# Patient Record
Sex: Female | Born: 1980 | Race: White | Hispanic: No | Marital: Married | Smoking: Former smoker
Health system: Southern US, Community
[De-identification: ages and names within clinical notes are randomized; demographics above are authoritative.]

## PROBLEM LIST (undated history)

## (undated) DIAGNOSIS — F419 Anxiety disorder, unspecified: Secondary | ICD-10-CM

## (undated) DIAGNOSIS — M1990 Unspecified osteoarthritis, unspecified site: Secondary | ICD-10-CM

## (undated) DIAGNOSIS — F39 Unspecified mood [affective] disorder: Secondary | ICD-10-CM

## (undated) DIAGNOSIS — K219 Gastro-esophageal reflux disease without esophagitis: Secondary | ICD-10-CM

## (undated) DIAGNOSIS — J45909 Unspecified asthma, uncomplicated: Secondary | ICD-10-CM

## (undated) DIAGNOSIS — I10 Essential (primary) hypertension: Secondary | ICD-10-CM

## (undated) DIAGNOSIS — R7309 Other abnormal glucose: Secondary | ICD-10-CM

## (undated) DIAGNOSIS — F319 Bipolar disorder, unspecified: Secondary | ICD-10-CM

## (undated) DIAGNOSIS — F909 Attention-deficit hyperactivity disorder, unspecified type: Secondary | ICD-10-CM

## (undated) DIAGNOSIS — R011 Cardiac murmur, unspecified: Secondary | ICD-10-CM

## (undated) DIAGNOSIS — F4310 Post-traumatic stress disorder, unspecified: Secondary | ICD-10-CM

## (undated) DIAGNOSIS — F32A Depression, unspecified: Secondary | ICD-10-CM

## (undated) DIAGNOSIS — G43909 Migraine, unspecified, not intractable, without status migrainosus: Secondary | ICD-10-CM

## (undated) DIAGNOSIS — L509 Urticaria, unspecified: Secondary | ICD-10-CM

## (undated) DIAGNOSIS — T783XXA Angioneurotic edema, initial encounter: Secondary | ICD-10-CM

## (undated) DIAGNOSIS — F329 Major depressive disorder, single episode, unspecified: Secondary | ICD-10-CM

## (undated) DIAGNOSIS — T7840XA Allergy, unspecified, initial encounter: Secondary | ICD-10-CM

## (undated) DIAGNOSIS — R1310 Dysphagia, unspecified: Secondary | ICD-10-CM

## (undated) DIAGNOSIS — R569 Unspecified convulsions: Secondary | ICD-10-CM

## (undated) DIAGNOSIS — L409 Psoriasis, unspecified: Secondary | ICD-10-CM

## (undated) DIAGNOSIS — J309 Allergic rhinitis, unspecified: Secondary | ICD-10-CM

## (undated) DIAGNOSIS — D649 Anemia, unspecified: Secondary | ICD-10-CM

## (undated) DIAGNOSIS — F1910 Other psychoactive substance abuse, uncomplicated: Secondary | ICD-10-CM

## (undated) HISTORY — DX: Unspecified mood (affective) disorder: F39

## (undated) HISTORY — DX: Psoriasis, unspecified: L40.9

## (undated) HISTORY — PX: TUBAL LIGATION: SHX77

## (undated) HISTORY — DX: Unspecified osteoarthritis, unspecified site: M19.90

## (undated) HISTORY — DX: Bipolar disorder, unspecified: F31.9

## (undated) HISTORY — DX: Attention-deficit hyperactivity disorder, unspecified type: F90.9

## (undated) HISTORY — DX: Unspecified asthma, uncomplicated: J45.909

## (undated) HISTORY — DX: Allergy, unspecified, initial encounter: T78.40XA

## (undated) HISTORY — DX: Anxiety disorder, unspecified: F41.9

## (undated) HISTORY — DX: Migraine, unspecified, not intractable, without status migrainosus: G43.909

## (undated) HISTORY — DX: Post-traumatic stress disorder, unspecified: F43.10

## (undated) HISTORY — DX: Depression, unspecified: F32.A

## (undated) HISTORY — DX: Angioneurotic edema, initial encounter: T78.3XXA

## (undated) HISTORY — DX: Allergic rhinitis, unspecified: J30.9

## (undated) HISTORY — DX: Anemia, unspecified: D64.9

## (undated) HISTORY — DX: Cardiac murmur, unspecified: R01.1

## (undated) HISTORY — DX: Urticaria, unspecified: L50.9

## (undated) HISTORY — DX: Gastro-esophageal reflux disease without esophagitis: K21.9

## (undated) HISTORY — DX: Dysphagia, unspecified: R13.10

## (undated) HISTORY — PX: CERVICAL BIOPSY  W/ LOOP ELECTRODE EXCISION: SUR135

## (undated) HISTORY — DX: Other psychoactive substance abuse, uncomplicated: F19.10

## (undated) HISTORY — DX: Major depressive disorder, single episode, unspecified: F32.9

## (undated) HISTORY — DX: Essential (primary) hypertension: I10

---
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
## 2007-04-15 HISTORY — PX: HEMORROIDECTOMY: SUR656

## 2007-04-15 HISTORY — PX: TUBAL LIGATION: SHX77

## 2008-04-14 HISTORY — PX: INGUINAL HERNIA REPAIR: SHX194

## 2014-04-14 DIAGNOSIS — L409 Psoriasis, unspecified: Secondary | ICD-10-CM

## 2014-04-14 HISTORY — DX: Psoriasis, unspecified: L40.9

## 2015-11-13 DIAGNOSIS — F4310 Post-traumatic stress disorder, unspecified: Secondary | ICD-10-CM

## 2015-11-13 DIAGNOSIS — F419 Anxiety disorder, unspecified: Secondary | ICD-10-CM

## 2015-11-13 HISTORY — DX: Anxiety disorder, unspecified: F41.9

## 2015-11-13 HISTORY — DX: Post-traumatic stress disorder, unspecified: F43.10

## 2016-01-09 ENCOUNTER — Other Ambulatory Visit: Payer: Self-pay

## 2016-01-09 ENCOUNTER — Ambulatory Visit (INDEPENDENT_AMBULATORY_CARE_PROVIDER_SITE_OTHER): Payer: Medicaid Other | Admitting: Allergy

## 2016-01-09 ENCOUNTER — Encounter: Payer: Self-pay | Admitting: Allergy

## 2016-01-09 VITALS — BP 110/70 | HR 100 | Temp 98.3°F | Resp 16 | Ht 62.09 in | Wt 156.4 lb

## 2016-01-09 DIAGNOSIS — L508 Other urticaria: Secondary | ICD-10-CM

## 2016-01-09 DIAGNOSIS — H1010 Acute atopic conjunctivitis, unspecified eye: Secondary | ICD-10-CM | POA: Diagnosis not present

## 2016-01-09 DIAGNOSIS — J309 Allergic rhinitis, unspecified: Secondary | ICD-10-CM | POA: Diagnosis not present

## 2016-01-09 DIAGNOSIS — T783XXA Angioneurotic edema, initial encounter: Secondary | ICD-10-CM | POA: Insufficient documentation

## 2016-01-09 DIAGNOSIS — J4540 Moderate persistent asthma, uncomplicated: Secondary | ICD-10-CM | POA: Diagnosis not present

## 2016-01-09 MED ORDER — FLUTICASONE PROPIONATE 50 MCG/ACT NA SUSP: NASAL | 5 refills | Status: DC

## 2016-01-09 MED ORDER — IPRATROPIUM-ALBUTEROL 0.5-2.5 (3) MG/3ML IN SOLN: 3.0000 mL | Freq: Four times a day (QID) | RESPIRATORY_TRACT | Status: AC

## 2016-01-09 MED ORDER — FEXOFENADINE HCL 180 MG PO TABS: 180.0000 mg | ORAL_TABLET | Freq: Every day | ORAL | 5 refills | Status: DC

## 2016-01-09 MED ORDER — RANITIDINE HCL 150 MG PO TABS: 150.0000 mg | ORAL_TABLET | Freq: Two times a day (BID) | ORAL | 5 refills | Status: DC

## 2016-01-09 MED ORDER — BECLOMETHASONE DIPROPIONATE 80 MCG/ACT IN AERS: 2.0000 | INHALATION_SPRAY | Freq: Two times a day (BID) | RESPIRATORY_TRACT | 5 refills | Status: DC

## 2016-01-09 NOTE — Patient Instructions (Addendum)
Hives and swelling, chronic     - will obtain following labs: CBC w diff, CMP, ESR, hive panel, tryptase     - start Allegra 136m and Zantac 1547mtwice a day     - may use benadryl 25-5075ms needed for breakthrough symptoms     - let us [place] if you have any of these symptoms with your hives and swelling: fevers, joint pains, marks or bruising (without scratching)  Asthma    - currently not well controlled with as needed albuterol     - start Qvar 80 2 puffs twice a day    - use albuterol as needed   Asthma control goals:   Full participation in all desired activities (may need albuterol before activity)  Albuterol use two time or less a week on average (not counting use with activity)  Cough interfering with sleep two time or less a month  Oral steroids no more than once a year  No hospitalizations  Seasonal Allergies    - will obtain environmental allergen panel via blood work    - take [employer] above    - if still having nasal congestion or drainage recommend using Flonase 1-2 spray each nostril as needed  Follow-up 1-2 months

## 2016-01-09 NOTE — Progress Notes (Signed)
New Patient Note  RE: Sarah Moreno MRN: [id number] DOB: 1980/08/18 Date of Office Visit: 01/09/2016  Referring provider: Enid Skeens., MD Primary care provider: Enid Skeens., MD  Chief Complaint: Hives, asthma  History of present illness: Sarah Moreno is a 35 y.o. female presenting today for consultation for hives and asthma. She presents today with her boyfriend.  She has been having hives now for the past 6-7 months.  Initially she would have hives 1-2 times a week which has progressed to daily hives.  She has to take benadryl at night to be able to sleep. Hives resolve in 1-2 days and can occur all over her body. Hives are red and raised and itchy.  They do not leave any marks or bruising.  She does scratch and break the skin because of the itch and thus will have scabbing and bruising related to that.  She does endorse swelling of her neck with the hives. She reports that hot showers make the hives worse otherwise she has not noted any other things that make the hives worse like pressure or exercise.  She has taken ibuprofen on occasion and has not noted any worsening of her hives. She denies any preceding illnesses, no new foods or medications, no stings, no change in soaps or lotions or detergents.  She has not tried to take anything else besides Benadryl. She was prescribed EpiPen by her PCP.  She does have asthma and was diagnosed during her first pregnancy in 2006.  She uses proair 3-4 times a day 2 puffs for wheezing and coughing over past week due to PNA.  She was diagnosed with PNA on exam last week and is on levaquin and steroids; she has 2 more days.  Subsequently she has not noted any improvement in her hives being on steroids currently. Prior to her illness she reports using Pro Air at least once a week.   She does endorse nighttime awakenings 2 nights/week.  She has never been on ICS or other controller medications. She feels that her asthma symptoms are not well-controlled.  She has identified triggers that worsen her asthma symptoms include being around cut grass, pollens, cats, after rains, cold weather and illnesses.    She does have itchy water eyes, nasal congestion and drainage as well as sneezing worse during spring, summer and fall. She reports the winter usually good from an allergy standpoint.  Usually will take benadryl.    She does have a history of smoking for 4 years and stopped in 2000.      Review of systems: Review of Systems  Constitutional: Negative for fever.  HENT: Positive for congestion. Negative for sore throat.   Eyes: Negative for redness.  Respiratory: Positive for cough, shortness of breath and wheezing.   Cardiovascular: Negative for chest pain.  Gastrointestinal: Negative for nausea and vomiting.  Skin: Positive for itching and rash.  Neurological: Negative for headaches.  Endo/Heme/Allergies: Positive for environmental allergies.  Psychiatric/Behavioral: Positive for depression. The patient is nervous/anxious.     All other systems negative unless noted above in HPI  Past medical history: Past Medical History:  Diagnosis Date  . Angio-edema   . Anxiety 11/2015  . Asthma   . Bipolar 1 disorder ([place]) 2000  . Psoriasis 2016  . PTSD (post-traumatic stress disorder) 11/2015  . Urticaria     Past surgical history: Past Surgical History:  Procedure Laterality Date  . INGUINAL HERNIA REPAIR Left 2010  . TUBAL LIGATION  2009  Family history:  Family History  Problem Relation Age of Onset  . Depression Mother   . Depression Father   . COPD Father   . Depression Sister   . Breast cancer Maternal Aunt   . Schizophrenia Paternal Aunt   . Depression Paternal Aunt   . Suicidality Paternal Uncle   . Depression Paternal Uncle   . Depression Sister   . Epilepsy Sister   . Breast cancer Maternal Aunt   . Breast cancer Maternal Aunt   . Depression Paternal Aunt   . Depression Paternal Aunt   . Depression Paternal  Aunt   . Depression Paternal Aunt   . Depression Paternal Aunt   . Depression Paternal Uncle   . Depression Paternal Uncle     Social history: She lives in a home with carpeting with electric heating and central cooling. There is a dog in the bearded dragon in the home. She is unemployed.   Medication List:   Medication List       Accurate as of 01/09/16  3:56 PM. Always use your most recent med list.          beclomethasone 80 MCG/ACT inhaler Commonly known as:  QVAR Inhale 2 puffs into the lungs 2 (two) times daily.   fexofenadine 180 MG tablet Commonly known as:  ALLEGRA Take 1 tablet (180 mg total) by mouth daily.   fluticasone 50 MCG/ACT nasal spray Commonly known as:  FLONASE Use 1-2 sprays in each nostril once daily as needed   levofloxacin 500 MG tablet Commonly known as:  LEVAQUIN Take 1 tablet by mouth daily. 10 day rx   Oxcarbazepine 300 MG tablet Commonly known as:  TRILEPTAL Take 1 tablet by mouth 2 (two) times daily.   predniSONE 10 MG tablet Commonly known as:  DELTASONE Taper dose x 10 days   PROAIR HFA 108 (90 Base) MCG/ACT inhaler Generic drug:  albuterol Inhale 2 puffs into the lungs every 4 (four) hours as needed for wheezing or shortness of breath.   ranitidine 150 MG tablet Commonly known as:  ZANTAC Take 1 tablet (150 mg total) by mouth 2 (two) times daily.       Known medication allergies: Allergies  Allergen Reactions  . Nsaids Other (See Comments)    Unknown reaction  . Other Hives    Mouth numbing agents  . Penicillin G Hives    Unknown reaction     Physical examination: Blood pressure 110/70, pulse 100, temperature 98.3 F (36.8 C), temperature source Oral, resp. rate 16, height 5' 2.09" (1.577 m), weight 156 lb 6.4 oz (70.9 kg).  General: Alert, interactive, in no acute distress. HEENT: TMs pearly gray, turbinates moderately edematous with clear discharge, post-pharynx non erythematous. Neck: Supple without  lymphadenopathy. Lungs: Decreased breath sounds bilaterally without wheezing, rhonchi or rales. {no increased work of breathing. Coughing throughout encounter CV: Normal S1, S2 without murmurs. Abdomen: Nondistended, nontender. Skin: Scattered erythematous urticarial type lesions primarily located Arms, back , nonvesicular. Extremities:  No clubbing, cyanosis or edema. Neuro:   Grossly intact.  Diagnositics/Labs:  Spirometry: FEV1: 2.46L  84%, FVC: 3.17L  91%, ratio consistent with Nonobstructive pattern  Allergy testing: Deferred as patient is dermatographic Allergy testing results were read and interpreted by provider, documented by clinical staff.   Assessment and plan:   Urticaria with angioedema, chronic     - will obtain following labs: CBC w diff, CMP, ESR, hive panel, tryptase     - start Allegra 14m and Zantac  161m twice a day     - may use benadryl 25-514mas needed for breakthrough symptoms     - let us[place]ow if you have any of these symptoms with your urticaria and angioedema: fevers, joint pains, marks or bruising (without scratching)     - If she continues to have urticaria and angioedema despite high-dose antihistamine therapy will proceed with Xolair     - Discussed the option of Singulair with  patient today and given her history of bipolar depression and PTSD decided to not start on this medication     - She already has an EpiPen prescribed by her PCP  Asthma, moderate persistent    - currently not well controlled with as needed albuterol     - start Qvar 80 2 puffs twice a day    - use albuterol as needed   Asthma control goals:   Full participation in all desired activities (may need albuterol before activity)  Albuterol use two time or less a week on average (not counting use with activity)  Cough interfering with sleep two time or less a month  Oral steroids no more than once a year  No hospitalizations  Allergic rhinoconjunctivitis    - will  obtain environmental allergen panel via blood work    - take [employer] above    - if still having nasal congestion or drainage recommend using Flonase 1-2 spray each nostril as needed  Follow-up 1-2 months   I appreciate the opportunity to take part in [place]. Please do not hesitate to contact me with questions.  Sincerely,   ShPrudy FeelerMD Allergy/[facility]ology [facility]

## 2016-01-10 ENCOUNTER — Other Ambulatory Visit: Payer: Self-pay | Admitting: *Deleted

## 2016-01-10 DIAGNOSIS — J309 Allergic rhinitis, unspecified: Principal | ICD-10-CM

## 2016-01-10 DIAGNOSIS — H1010 Acute atopic conjunctivitis, unspecified eye: Secondary | ICD-10-CM

## 2016-01-10 MED ORDER — FLUTICASONE PROPIONATE 50 MCG/ACT NA SUSP: NASAL | 5 refills | Status: DC

## 2016-01-13 LAB — CBC WITH DIFFERENTIAL/PLATELET
BASOS: 0 %
Basophils Absolute: 0 10*3/uL (ref 0.0–0.2)
EOS (ABSOLUTE): 0.2 10*3/uL (ref 0.0–0.4)
EOS: 2 %
Hematocrit: 42.6 % (ref 34.0–46.6)
Hemoglobin: 14.5 g/dL (ref 11.1–15.9)
IMMATURE GRANULOCYTES: 0 %
Immature Grans (Abs): 0 10*3/uL (ref 0.0–0.1)
LYMPHS: 33 %
Lymphocytes Absolute: 4.6 10*3/uL — ABNORMAL HIGH (ref 0.7–3.1)
MCH: 29.4 pg (ref 26.6–33.0)
MCHC: 34 g/dL (ref 31.5–35.7)
MCV: 86 fL (ref 79–97)
MONOCYTES: 6 %
MONOS ABS: 0.8 10*3/uL (ref 0.1–0.9)
NEUTROS PCT: 59 %
Neutrophils Absolute: 8.4 10*3/uL — ABNORMAL HIGH (ref 1.4–7.0)
PLATELETS: 323 10*3/uL (ref 150–379)
RBC: 4.94 x10E6/uL (ref 3.77–5.28)
RDW: 14.5 % (ref 12.3–15.4)
WBC: 14.1 10*3/uL — AB (ref 3.4–10.8)

## 2016-01-13 LAB — ALLERGENS W/TOTAL IGE AREA 2
Bermuda Grass IgE: <0.1 kU/L
Cat Dander IgE: <0.1 kU/L
Cladosporium Herbarum IgE: <0.1 kU/L
Dog Dander IgE: <0.1 kU/L
Elm, American IgE: <0.1 kU/L
IgE (Immunoglobulin E), Serum: 43 IU/mL (ref 0–100)
Johnson Grass IgE: <0.1 kU/L
Maple/Box Elder IgE: <0.1 kU/L
Pecan, Hickory IgE: <0.1 kU/L
Penicillium Chrysogen IgE: <0.1 kU/L
Sheep Sorrel IgE Qn: <0.1 kU/L
Timothy Grass IgE: <0.1 kU/L
White Mulberry IgE: <0.1 kU/L

## 2016-01-13 LAB — COMPREHENSIVE METABOLIC PANEL
A/G RATIO: 1.6 (ref 1.2–2.2)
ALT: 9 IU/L (ref 0–32)
AST: 11 IU/L (ref 0–40)
Albumin: 4.7 g/dL (ref 3.5–5.5)
Alkaline Phosphatase: 95 IU/L (ref 39–117)
BUN/Creatinine Ratio: 9 (ref 9–23)
BUN: 9 mg/dL (ref 6–20)
Bilirubin Total: <0.2 mg/dL (ref 0.0–1.2)
CALCIUM: 9.5 mg/dL (ref 8.7–10.2)
CHLORIDE: 100 mmol/L (ref 96–106)
CO2: 26 mmol/L (ref 18–29)
Creatinine, Ser: 1.03 mg/dL — ABNORMAL HIGH (ref 0.57–1.00)
GFR calc Af Amer: 83 mL/min/{1.73_m2} (ref >59)
GFR, EST NON AFRICAN AMERICAN: 72 mL/min/{1.73_m2} (ref >59)
Globulin, Total: 2.9 g/dL (ref 1.5–4.5)
Glucose: 104 mg/dL — ABNORMAL HIGH (ref 65–99)
POTASSIUM: 4 mmol/L (ref 3.5–5.2)
Sodium: 144 mmol/L (ref 134–144)
Total Protein: 7.6 g/dL (ref 6.0–8.5)

## 2016-01-13 LAB — TRYPTASE: Tryptase: 4.2 ug/L (ref 2.2–13.2)

## 2016-02-04 ENCOUNTER — Telehealth: Payer: Self-pay | Admitting: *Deleted

## 2016-02-04 NOTE — Telephone Encounter (Signed)
Pt called to check on lab results from 01-09-16. For an unknown reason, results were never sent to Dr. Delorse LekPadgett to be reviewed. Per Dr. Lucie LeatherKozlow - All testing was normal, patient informed

## 2016-02-14 ENCOUNTER — Other Ambulatory Visit: Payer: Self-pay | Admitting: Obstetrics & Gynecology

## 2016-02-14 DIAGNOSIS — N6452 Nipple discharge: Secondary | ICD-10-CM

## 2016-02-21 ENCOUNTER — Other Ambulatory Visit: Payer: Self-pay

## 2016-05-02 ENCOUNTER — Other Ambulatory Visit: Payer: Self-pay

## 2016-05-05 ENCOUNTER — Other Ambulatory Visit: Payer: Self-pay

## 2016-07-15 ENCOUNTER — Other Ambulatory Visit: Payer: Self-pay | Admitting: Allergy and Immunology

## 2016-07-15 DIAGNOSIS — J4540 Moderate persistent asthma, uncomplicated: Secondary | ICD-10-CM

## 2016-07-28 ENCOUNTER — Other Ambulatory Visit: Payer: Self-pay | Admitting: Allergy & Immunology

## 2016-07-28 DIAGNOSIS — T783XXA Angioneurotic edema, initial encounter: Secondary | ICD-10-CM

## 2016-07-28 DIAGNOSIS — L508 Other urticaria: Secondary | ICD-10-CM

## 2016-09-03 ENCOUNTER — Other Ambulatory Visit: Payer: Self-pay | Admitting: Allergy and Immunology

## 2016-09-03 DIAGNOSIS — J4540 Moderate persistent asthma, uncomplicated: Secondary | ICD-10-CM

## 2017-02-17 ENCOUNTER — Other Ambulatory Visit: Payer: Self-pay | Admitting: Allergy and Immunology

## 2017-02-17 DIAGNOSIS — H1010 Acute atopic conjunctivitis, unspecified eye: Secondary | ICD-10-CM

## 2017-02-17 DIAGNOSIS — J4540 Moderate persistent asthma, uncomplicated: Secondary | ICD-10-CM

## 2017-02-17 DIAGNOSIS — J309 Allergic rhinitis, unspecified: Principal | ICD-10-CM

## 2018-01-27 ENCOUNTER — Encounter: Payer: Self-pay | Admitting: Gastroenterology

## 2018-03-01 ENCOUNTER — Encounter: Payer: Self-pay | Admitting: Emergency Medicine
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
## 2018-03-02 ENCOUNTER — Encounter: Payer: Self-pay | Admitting: Gastroenterology

## 2018-03-02 ENCOUNTER — Ambulatory Visit: Payer: Medicaid Other | Admitting: Gastroenterology

## 2018-03-02 ENCOUNTER — Other Ambulatory Visit (HOSPITAL_COMMUNITY): Payer: Self-pay | Admitting: *Deleted

## 2018-03-02 VITALS — BP 106/78 | HR 70 | Ht 63.0 in | Wt 180.4 lb

## 2018-03-02 DIAGNOSIS — R1310 Dysphagia, unspecified: Secondary | ICD-10-CM

## 2018-03-02 DIAGNOSIS — K625 Hemorrhage of anus and rectum: Secondary | ICD-10-CM

## 2018-03-02 MED ORDER — OMEPRAZOLE 40 MG PO CPDR: 40.0000 mg | DELAYED_RELEASE_CAPSULE | Freq: Two times a day (BID) | ORAL | 3 refills | Status: DC

## 2018-03-02 NOTE — Patient Instructions (Signed)
Use Omeprazole 40 mg twice a day for 2 months.   Add a daily fiber powder such as Benefiber or Citrucel.     You have been scheduled for a modified barium swallow on 03/09/18 at 1:00 pm. Please arrive 15 minutes prior to your test for registration. [facility] for your appointment. Should you need to cancel or reschedule your appointment, please contact [phone] Patrcia Dolly([place]) or [phone] [facility]([place]). _____________________________________________________________________ A Modified Barium Swallow Study, or MBS, is a special x-ray that is taken to check swallowing skills. It is carried out by a Marine scientistadiologist and a Warehouse managerpeech Language Pathologist (SLP). During this test, your mouth, throat, and esophagus, a muscular tube which connects your mouth to your stomach, is checked. The test will help you, your doctor, and the SLP plan what types of foods and liquids are easier for you to swallow. The SLP will also identify positions and ways to help you swallow more easily and safely. What will happen during an MBS? You will be taken to an x-ray room and seated comfortably. You will be asked to swallow small amounts of food and liquid mixed with barium. Barium is a liquid or paste that allows images of your mouth, throat and esophagus to be seen on x-ray. The x-ray captures moving images of the food you are swallowing as it travels from your mouth through your throat and into your esophagus. This test helps identify whether food or liquid is entering your lungs (aspiration). The test also shows which part of your mouth or throat lacks strength or coordination to move the food or liquid in the right direction. This test typically takes 30 minutes to 1 hour to complete. _______________________________________________________________________

## 2018-03-09 ENCOUNTER — Ambulatory Visit (HOSPITAL_COMMUNITY)
Admission: RE | Admit: 2018-03-09 | Discharge: 2018-03-09 | Disposition: A | Discharge status: Home / Self Care | Payer: Medicaid Other | Source: Ambulatory Visit | Attending: Gastroenterology | Admitting: Gastroenterology

## 2018-03-09 ENCOUNTER — Encounter (HOSPITAL_COMMUNITY): Payer: Self-pay

## 2018-03-09 DIAGNOSIS — R1310 Dysphagia, unspecified: Secondary | ICD-10-CM | POA: Insufficient documentation

## 2018-03-29 ENCOUNTER — Ambulatory Visit (AMBULATORY_SURGERY_CENTER): Payer: Self-pay

## 2018-03-29 VITALS — Ht 62.0 in | Wt 184.0 lb

## 2018-03-29 DIAGNOSIS — K625 Hemorrhage of anus and rectum: Secondary | ICD-10-CM

## 2018-03-29 MED ORDER — NA SULFATE-K SULFATE-MG SULF 17.5-3.13-1.6 GM/177ML PO SOLN: 1.0000 | Freq: Once | ORAL | 0 refills | Status: AC

## 2018-03-29 NOTE — Progress Notes (Signed)
Denies allergies to eggs or soy products. Denies complication of anesthesia or sedation. Denies use of weight loss medication. Denies use of O2.   Emmi instructions declined.  

## 2018-04-20 ENCOUNTER — Encounter: Payer: Medicaid Other | Admitting: Gastroenterology

## 2018-04-26 ENCOUNTER — Encounter: Payer: Self-pay | Admitting: Gastroenterology

## 2018-04-26 ENCOUNTER — Ambulatory Visit (AMBULATORY_SURGERY_CENTER): Payer: Medicaid Other | Admitting: Gastroenterology

## 2018-04-26 VITALS — BP 149/76 | HR 78 | Temp 98.6°F | Resp 17 | Ht 62.0 in | Wt 184.0 lb

## 2018-04-26 DIAGNOSIS — K625 Hemorrhage of anus and rectum: Secondary | ICD-10-CM

## 2018-04-26 DIAGNOSIS — R1310 Dysphagia, unspecified: Secondary | ICD-10-CM

## 2018-04-26 DIAGNOSIS — R1319 Other dysphagia: Secondary | ICD-10-CM

## 2018-04-26 DIAGNOSIS — K2270 Barrett's esophagus without dysplasia: Secondary | ICD-10-CM | POA: Diagnosis not present

## 2018-04-26 DIAGNOSIS — K210 Gastro-esophageal reflux disease with esophagitis: Secondary | ICD-10-CM | POA: Diagnosis not present

## 2018-04-26 HISTORY — PX: UPPER GASTROINTESTINAL ENDOSCOPY: SHX188

## 2018-04-26 MED ORDER — SODIUM CHLORIDE 0.9 % IV SOLN: 500.0000 mL | Freq: Once | INTRAVENOUS | Status: DC

## 2018-04-26 NOTE — Op Note (Signed)
[facility] Patient Name: Sarah RoeLois Yi Procedure Date: 04/26/2018 10:19 AM MRN: [id number] Endoscopist: Tressia DanasKimberly Janila Arrazola MD, MD Age: 38 Referring MD:  Date of Birth: Jan 25, 1981 Gender: Female Account #: [id number] Procedure:                Upper GI endoscopy Indications:              Suspected gastro-esophageal reflux disease Medicines:                See the Anesthesia note for documentation of the                            administered medications Procedure:                Pre-Anesthesia Assessment:                           - Prior to the procedure, a History and Physical                            was performed, and patient medications and                            allergies were reviewed. The patient's tolerance of                            previous anesthesia was also reviewed. The risks                            and benefits of the procedure and the sedation                            options and risks were discussed with the patient.                            All questions were answered, and informed consent                            was obtained. Prior Anticoagulants: The patient has                            taken no previous anticoagulant or antiplatelet                            agents. ASA Grade Assessment: II - A patient with                            mild systemic disease. After reviewing the risks                            and benefits, the patient was deemed in                            satisfactory condition to undergo the procedure.  After obtaining informed consent, the endoscope was                            passed under direct vision. Throughout the                            procedure, the patient's blood pressure, pulse, and                            oxygen saturations were monitored continuously. The                            Endoscope was introduced through the mouth, and                            advanced to  the second part of duodenum. The upper                            GI endoscopy was accomplished without difficulty.                            The patient tolerated the procedure well. Scope In: Scope Out: Findings:                 LA Grade B (one or more mucosal breaks greater than                            5 mm, not extending between the tops of two mucosal                            folds) esophagitis with no bleeding was found.                            Biopsies were taken with a cold forceps for                            histology.                           Diffuse nodular mucosa was found in the gastric                            body. Biopsies were taken with a cold forceps for                            histology. Estimated blood loss was minimal.                           The examined duodenum was normal. Complications:            No immediate complications. Estimated blood loss:                            Minimal. Estimated Blood Loss:     Estimated blood loss was  minimal. Impression:               - LA Grade B reflux esophagitis. Rule out Barrett's                            esophagus. Biopsied.                           - Nodular mucosa in the gastric body. Biopsied.                           - Normal examined duodenum. Recommendation:           - Discharge patient to home.                           - Resume regular diet.                           - Continue present medications including omeprazole.                           - Proceed with colonoscopy today as previously                            planned.                           - Follow-up in the office - next available - to                            review these results. Tressia Danas MD, MD 04/26/2018 11:06:09 AM This report has been signed electronically.

## 2018-04-26 NOTE — Progress Notes (Signed)
Pt's states no medical or surgical changes since previsit or office visit. 

## 2018-04-26 NOTE — Progress Notes (Signed)
Called to room to assist during endoscopic procedure.  Patient ID and intended procedure confirmed with present staff. Received instructions for my participation in the procedure from the performing physician.  

## 2018-04-26 NOTE — Op Note (Signed)
[facility] Patient Name: Sarah Moreno Procedure Date: 04/26/2018 10:19 AM MRN: [id number] Endoscopist: Tressia DanasKimberly Jaylynn Mcaleer MD, MD Age: 38 Referring MD:  Date of Birth: 11-Mar-1981 Gender: Female Account #: [id number] Procedure:                Colonoscopy Indications:              Rectal bleeding Medicines:                See the Anesthesia note for documentation of the                            administered medications Procedure:                Pre-Anesthesia Assessment:                           - Prior to the procedure, a History and Physical                            was performed, and patient medications and                            allergies were reviewed. The patient's tolerance of                            previous anesthesia was also reviewed. The risks                            and benefits of the procedure and the sedation                            options and risks were discussed with the patient.                            All questions were answered, and informed consent                            was obtained. Prior Anticoagulants: The patient has                            taken no previous anticoagulant or antiplatelet                            agents. ASA Grade Assessment: I - A normal, healthy                            patient. After reviewing the risks and benefits,                            the patient was deemed in satisfactory condition to                            undergo the procedure.  After obtaining informed consent, the colonoscope                            was passed under direct vision. Throughout the                            procedure, the patient's blood pressure, pulse, and                            oxygen saturations were monitored continuously. The                            Colonoscope was introduced through the anus and                            advanced to the the terminal ileum, with              identification of the appendiceal orifice and IC                            valve. The colonoscopy was performed without                            difficulty. The patient tolerated the procedure                            well. The quality of the bowel preparation was good. Scope In: 10:41:23 AM Scope Out: 10:54:09 AM Scope Withdrawal Time: 0 hours 10 minutes 37 seconds  Total Procedure Duration: 0 hours 12 minutes 46 seconds  Findings:                 Hemorrhoids were found on perianal exam.                           Non-bleeding external and internal hemorrhoids were                            found. The hemorrhoids were small.                           The exam was otherwise without abnormality on                            direct and retroflexion views. Complications:            No immediate complications. Estimated Blood Loss:     Estimated blood loss: none. Impression:               - Hemorrhoids found on perianal exam.                           - Non-bleeding external and internal hemorrhoids.                           - The examination was otherwise normal on direct  and retroflexion views.                           - No specimens collected. Recommendation:           - Discharge patient to home.                           - Resume previous diet.                           - Daily psyllium (Metamucil, Citrucel, etc)                            recommended to keep the stool soft.                           - Continue present medications.                           - Continue with routine colon cancer screening.                            Next colonoscopy in 10 years at age 11, or earlier                            with any new symptoms. Tressia Danas MD, MD 04/26/2018 11:09:24 AM This report has been signed electronically.

## 2018-04-26 NOTE — Patient Instructions (Signed)
Use psyllium (Metamucil, Citrucel, etc) daily to keep stool soft.   YOU HAD AN ENDOSCOPIC PROCEDURE TODAY AT THE [facility]:   Refer to the procedure report that was given to you for any specific questions about what was found during the examination.  If the procedure report does not answer your questions, please call your gastroenterologist to clarify.  If you requested that your care partner not be given the details of your procedure findings, then the procedure report has been included in a sealed envelope for you to review at your convenience later.  YOU SHOULD EXPECT: Some feelings of bloating in the abdomen. Passage of more gas than usual.  Walking can help get rid of the air that was put into your GI tract during the procedure and reduce the bloating. If you had a lower endoscopy (such as a colonoscopy or flexible sigmoidoscopy) you may notice spotting of blood in your stool or on the toilet paper. If you underwent a bowel prep for your procedure, you may not have a normal bowel movement for a few days.  Please Note:  You might notice some irritation and congestion in your nose or some drainage.  This is from the oxygen used during your procedure.  There is no need for concern and it should clear up in a day or so.  SYMPTOMS TO REPORT IMMEDIATELY:   Following lower endoscopy (colonoscopy or flexible sigmoidoscopy):  Excessive amounts of blood in the stool  Significant tenderness or worsening of abdominal pains  Swelling of the abdomen that is new, acute  Fever of 100F or higher   Following upper endoscopy (EGD)  Vomiting of blood or coffee ground material  New chest pain or pain under the shoulder blades  Painful or persistently difficult swallowing  New shortness of breath  Fever of 100F or higher  Black, tarry-looking stools  For urgent or emergent issues, a gastroenterologist can be reached at any hour by calling [phone].   DIET:  We do recommend a small  meal at first, but then you may proceed to your regular diet.  Drink plenty of fluids but you should avoid alcoholic beverages for 24 hours.  ACTIVITY:  You should plan to take it easy for the rest of today and you should NOT DRIVE or use heavy machinery until tomorrow (because of the sedation medicines used during the test).    FOLLOW UP: Our staff will call the number listed on your records the next business day following your procedure to check on you and address any questions or concerns that you may have regarding the information given to you following your procedure. If we do not reach you, we will leave a message.  However, if you are feeling well and you are not experiencing any problems, there is no need to return our call.  We will assume that you have returned to your regular daily activities without incident.  If any biopsies were taken you will be contacted by phone or by letter within the next 1-3 weeks.  Please call us at [phone] if you have not heard about the biopsies in 3 weeks.    SIGNATURES/CONFIDENTIALITY: You and/or your care partner have signed paperwork which will be entered into your electronic medical record.  These signatures attest to the fact that that the information above on your After Visit Summary has been reviewed and is understood.  Full responsibility of the confidentiality of this discharge information lies with you and/or your care-partner.

## 2018-04-26 NOTE — Progress Notes (Signed)
Report given to PACU, vss 

## 2018-04-27 ENCOUNTER — Telehealth: Payer: Self-pay | Admitting: *Deleted

## 2018-04-27 NOTE — Telephone Encounter (Signed)
  Follow up Call-  Call back number 04/26/2018  Post procedure Call Back phone  # [phone]  Permission to leave phone message Yes     Patient questions:  Do you have a fever, pain , or abdominal swelling? Yes.   Pain Score  4 *  Have you tolerated food without any problems? Yes.    Have you been able to return to your normal activities? Yes.    Do you have any questions about your discharge instructions: Diet   No. Medications  No. Follow up visit  No.  Do you have questions or concerns about your Care? Yes.  Patient reports that she is having lower left and middle abdominal pain that is a 4/10 since her procedure. No fever or any other symptoms. Will notify Dr. Orvan Falconer.  Actions: * If pain score is 4 or above: Physician/ provider Notified : Cala Bradford L. Orvan Falconer, MD.

## 2018-04-27 NOTE — Telephone Encounter (Signed)
I am sorry to hear that she is having trouble. Her exam was performed without immediate complication or difficulty at the time. I am thankful that she has tolerated food. If the pain persists or worsens, I would like her to have an office visit to be evaluated. Please schedule with an APP if available. Thanks.

## 2018-04-27 NOTE — Telephone Encounter (Signed)
Called the patient back to discuss Dr. Daleen Bo recommendation regarding her abdominal pain. She reports that it has eased off since talking with her this am and if she isnt moving she has no pain, pain 3/10 when ambulating. She verbalized understanding and will call us and make an appointment if her pain persists or worsens Sm

## 2018-05-03 ENCOUNTER — Encounter: Payer: Self-pay | Admitting: Gastroenterology

## 2018-05-18 ENCOUNTER — Ambulatory Visit: Payer: Medicaid Other | Admitting: Gastroenterology

## 2018-05-18 ENCOUNTER — Encounter: Payer: Self-pay | Admitting: Gastroenterology

## 2018-05-18 VITALS — BP 124/74 | HR 84 | Ht 62.0 in | Wt 188.0 lb

## 2018-05-18 DIAGNOSIS — K22719 Barrett's esophagus with dysplasia, unspecified: Secondary | ICD-10-CM

## 2018-05-18 DIAGNOSIS — R933 Abnormal findings on diagnostic imaging of other parts of digestive tract: Secondary | ICD-10-CM

## 2018-05-18 DIAGNOSIS — R1310 Dysphagia, unspecified: Secondary | ICD-10-CM | POA: Diagnosis not present

## 2018-05-18 MED ORDER — FAMOTIDINE 20 MG PO TABS: 20.0000 mg | ORAL_TABLET | Freq: Two times a day (BID) | ORAL | 3 refills | Status: DC
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
## 2018-05-18 NOTE — Patient Instructions (Addendum)
* Continue Omeprazole 40 mg twice daily. * Add Pepcid 20 mg twice daily. * Barrett's brochure provided * I am recommending high resolution esophageal manometry and pH probe on medications to further evaluate your heartburn and swallowing problems. * Repeat EGD in 6 months for Barrett's biopsies  You have been scheduled for an esophageal manometry at [facility] on 05-19-18 at 8:30 am. Please arrive 30 minutes prior to your procedure for registration. You will need to go to outpatient registration (1st floor of the hospital) first. Make certain to bring your insurance cards as well as a complete list of medications.  Please remember the following:  1) Do not take any muscle relaxants, xanax (alprazolam) or ativan for 1 day prior to your test as well as the day of the test.  2) Nothing to eat or drink for 4 hours before your test.  3) Hold all diabetic medications/insulin the morning of the test. You may eat and take your medications after the test.  It will take at least 2 weeks to receive the results of this test from your physician. ------------------------------------------ ABOUT ESOPHAGEAL MANOMETRY Esophageal manometry (muh-NOM-uh-tree) is a test that gauges how well your esophagus works. Your esophagus is the long, muscular tube that connects your throat to your stomach. Esophageal manometry measures the rhythmic muscle contractions (peristalsis) that occur in your esophagus when you swallow. Esophageal manometry also measures the coordination and force exerted by the muscles of your esophagus.  During esophageal manometry, a thin, flexible tube (catheter) that contains sensors is passed through your nose, down your esophagus and into your stomach. Esophageal manometry can be helpful in diagnosing some mostly uncommon disorders that affect your esophagus.  Why it's done Esophageal manometry is used to evaluate the movement (motility) of food through the esophagus and into the  stomach. The test measures how well the circular bands of muscle (sphincters) at the top and bottom of your esophagus open and close, as well as the pressure, strength and pattern of the wave of esophageal muscle contractions that moves food along.  What you can expect Esophageal manometry is an outpatient procedure done without sedation. Most people tolerate it well. You may be asked to change into a hospital gown before the test starts.  During esophageal manometry  . While you are sitting up, a member of your health care team sprays your throat with a numbing medication or puts numbing gel in your nose or both.  . A catheter is guided through your nose into your esophagus. The catheter may be sheathed in a water-filled sleeve. It doesn't interfere with your breathing. However, your eyes may water, and you may gag. You may have a slight nosebleed from irritation.  . After the catheter is in place, you may be asked to lie on your back on an exam table, or you may be asked to remain seated.  . You then swallow small sips of water. As you do, a computer connected to the catheter records the pressure, strength and pattern of your esophageal muscle contractions.  . During the test, you'll be asked to breathe slowly and smoothly, remain as still as possible, and swallow only when you're asked to do so.  . A member of your health care team may move the catheter down into your stomach while the catheter continues its measurements.  . The catheter then is slowly withdrawn. The test usually lasts 20 to 30 minutes.  After esophageal manometry  When your esophageal manometry is complete,  you may return to your normal activities  This test typically takes 30-45 minutes to complete. ________________________________________________________________________________

## 2018-05-19 ENCOUNTER — Ambulatory Visit (HOSPITAL_COMMUNITY)
Admission: RE | Admit: 2018-05-19 | Discharge: 2018-05-19 | Disposition: A | Discharge status: Home / Self Care | Payer: Medicaid Other | Attending: Gastroenterology | Admitting: Gastroenterology

## 2018-05-19 ENCOUNTER — Encounter (HOSPITAL_COMMUNITY)
Admission: RE | Disposition: A | Discharge status: Home / Self Care | Payer: Self-pay | Source: Home / Self Care | Attending: Gastroenterology

## 2018-05-19 DIAGNOSIS — R1110 Vomiting, unspecified: Secondary | ICD-10-CM

## 2018-05-19 DIAGNOSIS — K219 Gastro-esophageal reflux disease without esophagitis: Secondary | ICD-10-CM | POA: Diagnosis not present

## 2018-05-19 DIAGNOSIS — K2270 Barrett's esophagus without dysplasia: Secondary | ICD-10-CM | POA: Diagnosis not present

## 2018-05-19 HISTORY — PX: PH IMPEDANCE STUDY: SHX5565

## 2018-05-19 HISTORY — PX: ESOPHAGEAL MANOMETRY: SHX5429

## 2018-05-19 SURGERY — MANOMETRY, ESOPHAGUS

## 2018-05-19 MED ORDER — LIDOCAINE VISCOUS HCL 2 % MT SOLN
OROMUCOSAL | Status: AC
  Filled 2018-05-19: qty 15

## 2018-05-19 SURGICAL SUPPLY — 2 items
FACESHIELD LNG OPTICON STERILE (SAFETY) IMPLANT
GLOVE BIO SURGEON STRL SZ8 (GLOVE) ×8 IMPLANT

## 2018-05-19 NOTE — Progress Notes (Signed)
Esophageal Manometry done per protocol. Pt tolerated well without distress. Ph probe inserted per protocol. Pt tolerated well and insructed using teach back regarding study and monitor use. Pt voiced understanding. Probe placed at 33.5cm pt to return to have probe removed at 0950 or after tomorrow.

## 2018-05-20 ENCOUNTER — Encounter (HOSPITAL_COMMUNITY): Payer: Self-pay | Admitting: Gastroenterology

## 2018-06-03 ENCOUNTER — Telehealth: Payer: Self-pay | Admitting: Gastroenterology

## 2018-06-03 NOTE — Telephone Encounter (Signed)
As mentioned in another task, the exams are essentially normal.  There is good acid suppression on her current medical regimen.  No evidence for esophageal motility disorder.  Thank you.

## 2018-06-03 NOTE — Telephone Encounter (Signed)
Patient notified

## 2018-06-03 NOTE — Telephone Encounter (Signed)
Dr Orvan Falconer, please review mano results under procedures tab and advise

## 2018-06-03 NOTE — Telephone Encounter (Signed)
Pt called about results 

## 2018-06-07 DIAGNOSIS — K219 Gastro-esophageal reflux disease without esophagitis: Secondary | ICD-10-CM

## 2018-06-07 DIAGNOSIS — R1110 Vomiting, unspecified: Secondary | ICD-10-CM

## 2018-08-16 ENCOUNTER — Other Ambulatory Visit: Payer: Self-pay | Admitting: Gastroenterology

## 2018-09-15 ENCOUNTER — Telehealth: Payer: Self-pay

## 2018-09-15 NOTE — Telephone Encounter (Signed)
Called and LM for pt to call back to schedule recall EGD in July with Dr. Orvan Falconer - Barretts.

## 2018-09-24 ENCOUNTER — Encounter: Payer: Self-pay | Admitting: Gastroenterology

## 2018-11-29 ENCOUNTER — Encounter: Payer: Self-pay | Admitting: Gastroenterology

## 2018-12-24 ENCOUNTER — Telehealth: Payer: Self-pay

## 2018-12-24 NOTE — Telephone Encounter (Signed)
Patient No Showed for PV today. Patient was called to reschedule her appointment. No answer. A message was left on voicemail to call and reschedule  PV before  5 Pm today. Patient was informed that if she didn't reschedule before 5 Pm her Endoscopy that is scheduled on 01/07/19 with Dr. Tarri Glenn would be cancelled. If patient does not reschedule a no show letter will be mailed at the end of the day.   Riki Sheer, LPN ( PV )

## 2018-12-30 ENCOUNTER — Other Ambulatory Visit: Payer: Self-pay

## 2018-12-30 ENCOUNTER — Ambulatory Visit (AMBULATORY_SURGERY_CENTER): Payer: Self-pay | Admitting: *Deleted

## 2018-12-30 VITALS — Temp 98.1°F | Ht 62.0 in | Wt 185.0 lb

## 2018-12-30 DIAGNOSIS — K2270 Barrett's esophagus without dysplasia: Secondary | ICD-10-CM

## 2018-12-30 NOTE — Progress Notes (Signed)
Patient is here in-person for PV. Patient denies any allergies to eggs or soy. Patient denies any problems with anesthesia/sedation. Patient denies any oxygen use at home. Patient denies taking any diet/weight loss medications or blood thinners. Patient is not being treated for MRSA or C-diff. Patient aware not to take Metformin the day of the procedure!   Pt is aware that care partner will wait in the car during procedure; if they feel like they will be too hot to wait in the car; they may wait in the lobby.  We want them to wear a mask (we do not have any that we can provide them), practice social distancing, and we will check their temperatures when they get here.  I did remind patient that their care partner needs to stay in the parking lot the entire time. Pt will wear mask into building.

## 2019-01-06 ENCOUNTER — Telehealth: Payer: Self-pay

## 2019-01-06 NOTE — Telephone Encounter (Signed)
Covid-19 screening questions   Do you now or have you had a fever in the last 14 days?  Do you have any respiratory symptoms of shortness of breath or cough now or in the last 14 days?  Do you have any family members or close contacts with diagnosed or suspected Covid-19 in the past 14 days?  Have you been tested for Covid-19 and found to be positive?       

## 2019-01-07 ENCOUNTER — Ambulatory Visit (AMBULATORY_SURGERY_CENTER): Payer: Medicare HMO | Admitting: Gastroenterology

## 2019-01-07 ENCOUNTER — Other Ambulatory Visit: Payer: Self-pay | Admitting: Gastroenterology

## 2019-01-07 ENCOUNTER — Encounter: Payer: Self-pay | Admitting: Gastroenterology

## 2019-01-07 ENCOUNTER — Other Ambulatory Visit: Payer: Self-pay

## 2019-01-07 VITALS — BP 109/66 | HR 75 | Temp 98.4°F | Resp 15 | Ht 62.0 in | Wt 185.0 lb

## 2019-01-07 DIAGNOSIS — K2270 Barrett's esophagus without dysplasia: Secondary | ICD-10-CM

## 2019-01-07 MED ORDER — SODIUM CHLORIDE 0.9 % IV SOLN: 500.0000 mL | INTRAVENOUS | Status: DC

## 2019-01-07 NOTE — Patient Instructions (Signed)
YOU HAD AN ENDOSCOPIC PROCEDURE TODAY AT THE [facility]:   Refer to the procedure report that was given to you for any specific questions about what was found during the examination.  If the procedure report does not answer your questions, please call your gastroenterologist to clarify.  If you requested that your care partner not be given the details of your procedure findings, then the procedure report has been included in a sealed envelope for you to review at your convenience later.  YOU SHOULD EXPECT: Some feelings of bloating in the abdomen. Passage of more gas than usual.  Walking can help get rid of the air that was put into your GI tract during the procedure and reduce the bloating. If you had a lower endoscopy (such as a colonoscopy or flexible sigmoidoscopy) you may notice spotting of blood in your stool or on the toilet paper. If you underwent a bowel prep for your procedure, you may not have a normal bowel movement for a few days.  Please Note:  You might notice some irritation and congestion in your nose or some drainage.  This is from the oxygen used during your procedure.  There is no need for concern and it should clear up in a day or so.  SYMPTOMS TO REPORT IMMEDIATELY:   Following upper endoscopy (EGD)  Vomiting of blood or coffee ground material  New chest pain or pain under the shoulder blades  Painful or persistently difficult swallowing  New shortness of breath  Fever of 100F or higher  Black, tarry-looking stools  For urgent or emergent issues, a gastroenterologist can be reached at any hour by calling [phone].   DIET:  We do recommend a small meal at first, but then you may proceed to your regular diet.  Drink plenty of fluids but you should avoid alcoholic beverages for 24 hours.  ACTIVITY:  You should plan to take it easy for the rest of today and you should NOT DRIVE or use heavy machinery until tomorrow (because of the sedation medicines used  during the test).    FOLLOW UP: Our staff will call the number listed on your records 48-72 hours following your procedure to check on you and address any questions or concerns that you may have regarding the information given to you following your procedure. If we do not reach you, we will leave a message.  We will attempt to reach you two times.  During this call, we will ask if you have developed any symptoms of COVID 19. If you develop any symptoms (ie: fever, flu-like symptoms, shortness of breath, cough etc.) before then, please call [phone].  If you test positive for Covid 19 in the 2 weeks post procedure, please call and report this information to us.    If any biopsies were taken you will be contacted by phone or by letter within the next 1-3 weeks.  Please call us at [phone] if you have not heard about the biopsies in 3 weeks.    SIGNATURES/CONFIDENTIALITY: You and/or your care partner have signed paperwork which will be entered into your electronic medical record.  These signatures attest to the fact that that the information above on your After Visit Summary has been reviewed and is understood.  Full responsibility of the confidentiality of this discharge information lies with you and/or your care-partner. 

## 2019-01-07 NOTE — Op Note (Signed)
[facility] Patient Name: Sarah RoeLois Norbeck Procedure Date: 01/07/2019 7:48 AM MRN: [id number] Endoscopist: Tressia DanasKimberly Catia Todorov MD, MD Age: 38 Referring MD:  Date of Birth: 1980/09/26 Gender: Female Account #: [id number] Procedure:                Upper GI endoscopy Indications:              Short segment Barrett's esophagus noted on an                            endoscopy with LA Class B reflux esophagitis                           Abnormal modified barium swallow study 03/09/18                           - ? High resting EUS pressures                           Recent complaints of unilateral throat pain                           Dysphagia + globus                           - not responding to 2 years of omeprazole 20 mg                            daily, 3 weeks of Carafate 1g BID                           - no esophagitis on biopsies                           - abnormal swallow study with high resting EUS                            pressure (cricopharyngeal hypertension or spasm) Medicines:                See the Anesthesia note for documentation of the                            administered medications Procedure:                Pre-Anesthesia Assessment:                           - Prior to the procedure, a History and Physical                            was performed, and patient medications and                            allergies were reviewed. The patient's tolerance of                            previous  anesthesia was also reviewed. The risks                            and benefits of the procedure and the sedation                            options and risks were discussed with the patient.                            All questions were answered, and informed consent                            was obtained. Prior Anticoagulants: The patient has                            taken no previous anticoagulant or antiplatelet                            agents. ASA Grade Assessment:  II - A patient with                            mild systemic disease. After reviewing the risks                            and benefits, the patient was deemed in                            satisfactory condition to undergo the procedure.                           After obtaining informed consent, the endoscope was                            passed under direct vision. Throughout the                            procedure, the patient's blood pressure, pulse, and                            oxygen saturations were monitored continuously. The                            Endoscope was introduced through the mouth, and                            advanced to the third part of duodenum. The upper                            GI endoscopy was accomplished without difficulty.                            The patient tolerated the procedure well. Scope In: Scope Out: Findings:  There were esophageal mucosal changes suspicious                            for Barrett's esophagus present in the lower third                            of the esophagus. The maximum longitudinal extent                            of these mucosal changes was 0.8 cm in length.                            Mucosa was biopsied with a cold forceps for                            histology in a targeted manner at intervals of 0.5                            cm at 34 cm from the incisors. Extensive biopsies                            were performed. One specimen bottle was sent to                            pathology. Estimated blood loss was minimal.                           The stomach was normal.                           The examined duodenum was normal.                           The cardia and gastric fundus were normal on                            retroflexion. Complications:            No immediate complications. Estimated blood loss:                            Minimal. Estimated Blood Loss:     Estimated blood  loss was minimal. Impression:               - Limited evaluation of the posterior oropharynx                            identified on this study. No source for recent                            throat pain identified.                           - Esophageal mucosal changes suspicious for  Barrett's esophagus. Biopsied.                           - Normal stomach.                           - Normal examined duodenum. Recommendation:           - Patient has a contact number available for                            emergencies. The signs and symptoms of potential                            delayed complications were discussed with the                            patient. Return to normal activities tomorrow.                            Written discharge instructions were provided to the                            patient.                           - Resume previous diet today.                           - Continue present medications - including                            omeprazole 40 mg twice daily and Pepcid 20 mg twice                            daily.                           - Await pathology results.                           - Repeat upper endoscopy in 3 years for                            surveillance of Barrett's esophagus.                           - Please contact Dr. Hal Hope about your sore                            throat. If no etiology is identified, you may                            benefit from evaluation with ENT. Tressia Danas MD, MD 01/07/2019 8:21:01 AM This report has been signed electronically.

## 2019-01-07 NOTE — Progress Notes (Signed)
Called to room to assist during endoscopic procedure.  Patient ID and intended procedure confirmed with present staff. Received instructions for my participation in the procedure from the performing physician.  

## 2019-01-07 NOTE — Progress Notes (Signed)
Temp by KA Vitals by CW

## 2019-01-11 ENCOUNTER — Telehealth: Payer: Self-pay

## 2019-01-11 NOTE — Telephone Encounter (Signed)
  Follow up Call-  Call back number 01/07/2019 04/26/2018  Post procedure Call Back phone  # [phone]  Permission to leave phone message Yes Yes     Patient questions:  Do you have a fever, pain , or abdominal swelling? No. Pain Score  0 *  Have you tolerated food without any problems? Yes.    Have you been able to return to your normal activities? Yes.    Do you have any questions about your discharge instructions: Diet   No. Medications  No. Follow up visit  No.  Do you have questions or concerns about your Care? No.  Actions: * If pain score is 4 or above: No action needed, pain <4.  1. Have you developed a fever since your procedure? no  2.   Have you had an respiratory symptoms (SOB or cough) since your procedure? yes  3.   Have you tested positive for COVID 19 since your procedure no  4.   Have you had any family members/close contacts diagnosed with the COVID 19 since your procedure?  No  Pt reported she that Friday night she started with tightness in her chest, cough, headache, and diarrhea.  Sat.pt c/o diffculity breathing and felt "bad".  Sunday am pt went out to breakfast and decided to be seen at [facility] on [address].  Pt was dx with bronchitis and given antibiotic.  Did not test her for COVID-19.  Pt reported if she is not improving to come back and will test for COVID-19.     If yes to any of these questions please route to Joylene John, RN and Alphonsa Gin, RN.

## 2019-01-18 ENCOUNTER — Encounter: Payer: Self-pay | Admitting: Gastroenterology

## 2019-01-19 ENCOUNTER — Telehealth: Payer: Self-pay | Admitting: Gastroenterology

## 2019-01-19 NOTE — Telephone Encounter (Signed)
Spoke to patient, read patient letter and reported to the patient she would soon receive the letter by mail.   Checked to make sure 3 year recall was placed for Barrett's surveillance, placed recall in Epic.

## 2019-05-09 ENCOUNTER — Encounter: Payer: Self-pay | Admitting: Obstetrics & Gynecology

## 2019-05-09 ENCOUNTER — Other Ambulatory Visit (HOSPITAL_COMMUNITY)
Admission: RE | Admit: 2019-05-09 | Discharge: 2019-05-09 | Disposition: A | Discharge status: Home / Self Care | Payer: Medicare HMO | Source: Ambulatory Visit | Attending: Obstetrics & Gynecology | Admitting: Obstetrics & Gynecology

## 2019-05-09 ENCOUNTER — Other Ambulatory Visit: Payer: Self-pay

## 2019-05-09 ENCOUNTER — Ambulatory Visit (INDEPENDENT_AMBULATORY_CARE_PROVIDER_SITE_OTHER): Payer: Medicare HMO | Admitting: Obstetrics & Gynecology

## 2019-05-09 VITALS — Temp 97.5°F | Wt 192.0 lb

## 2019-05-09 DIAGNOSIS — Z1151 Encounter for screening for human papillomavirus (HPV): Secondary | ICD-10-CM | POA: Insufficient documentation

## 2019-05-09 DIAGNOSIS — R87810 Cervical high risk human papillomavirus (HPV) DNA test positive: Secondary | ICD-10-CM | POA: Insufficient documentation

## 2019-05-09 DIAGNOSIS — Z124 Encounter for screening for malignant neoplasm of cervix: Secondary | ICD-10-CM | POA: Insufficient documentation

## 2019-05-09 DIAGNOSIS — F32A Depression, unspecified: Secondary | ICD-10-CM

## 2019-05-09 DIAGNOSIS — N3946 Mixed incontinence: Secondary | ICD-10-CM | POA: Diagnosis not present

## 2019-05-09 DIAGNOSIS — R102 Pelvic and perineal pain: Secondary | ICD-10-CM | POA: Insufficient documentation

## 2019-05-09 DIAGNOSIS — M5441 Lumbago with sciatica, right side: Secondary | ICD-10-CM | POA: Diagnosis not present

## 2019-05-09 DIAGNOSIS — B9689 Other specified bacterial agents as the cause of diseases classified elsewhere: Secondary | ICD-10-CM | POA: Diagnosis not present

## 2019-05-09 DIAGNOSIS — N760 Acute vaginitis: Secondary | ICD-10-CM | POA: Insufficient documentation

## 2019-05-09 DIAGNOSIS — F4312 Post-traumatic stress disorder, chronic: Secondary | ICD-10-CM

## 2019-05-09 DIAGNOSIS — F329 Major depressive disorder, single episode, unspecified: Secondary | ICD-10-CM

## 2019-05-09 LAB — POCT URINALYSIS DIPSTICK
Appearance: NORMAL
Bilirubin, UA: NEGATIVE
Blood, UA: NEGATIVE
Glucose, UA: NEGATIVE
Ketones, UA: NEGATIVE
Leukocytes, UA: NEGATIVE
Nitrite, UA: NEGATIVE
Protein, UA: NEGATIVE
Spec Grav, UA: <=1.005 — AB (ref 1.010–1.025)
Urobilinogen, UA: NEGATIVE E.U./dL — AB
pH, UA: 6.5 (ref 5.0–8.0)

## 2019-05-09 MED ORDER — IBUPROFEN 800 MG PO TABS: 800.0000 mg | ORAL_TABLET | Freq: Three times a day (TID) | ORAL | 1 refills | Status: DC | PRN

## 2019-05-09 NOTE — Patient Instructions (Signed)
Dyspareunia, Female Dyspareunia is pain that is associated with sexual activity. This can affect any part of the genitals or lower abdomen. There are many possible causes of this condition. In some cases, diagnosing the cause of dyspareunia can be difficult. This condition can be mild, moderate, or severe. Depending on the cause, dyspareunia may get better with treatment, but may return (recur) over time. What are the causes?  The cause of this condition is not always known. However, problems that affect the vulva, vagina, uterus, and other organs may cause dyspareunia. Common causes of this condition include:  Vaginal dryness.  Giving birth.  Infection.  Skin changes or conditions.  Side effects of medicines.  Endometriosis. This is when tissue that is like the lining of the uterus grows on the outside of the uterus.  Psychological conditions. These include depression, anxiety, or traumatic experiences.  Allergic reaction. What increases the risk? The following factors may make you more likely to develop this condition:  History of physical or sexual trauma.  Some medicines.  No longer having a monthly period (menopause).  Having recently given birth.  Taking baths using soaps that have perfumes. These can cause irritation.  Douching. What are the signs or symptoms? The main symptom of this condition is pain in any part of your genitals or lower abdomen during or after sex. This may include:  Irritation, burning, or stinging sensations in your vulva.  Discomfort when your vulva or surrounding area is touched.  Aching and throbbing pain that may be constant.  Pain that gets worse when something is inserted into your vagina. How is this diagnosed? This condition may be diagnosed based on:  Your symptoms, including where and when your pain occurs.  Your medical history.  A physical exam. A pelvic exam will most likely be done.  Tests that include ultrasound,  blood tests, and tests that check the body for infection.  Imaging tests, such as X-ray, MRI, and CT scan. You may be referred to a health care provider who specializes in women's health (gynecologist). How is this treated? Treatment depends on the cause of your condition and your symptoms. In most cases, you may need to stop sexual activity until your symptoms go away or get better. Treatment may include:  Lubricants, ointments, and creams.  Physical therapy.  Massage therapy.  Hormonal therapy.  Medicines to: ? Prevent or fight infection. ? Relieve pain. ? Help numb the area. ? Treat depression (antidepressants).  Counseling, which may include sex therapy.  Surgery. Follow these instructions at home: Lifestyle  Wear cotton underwear.  Use water-based lubricants as needed during sex. Avoid oil-based lubricants.  Do not use any products that can cause irritation. This may include certain condoms, spermicides, lubricants, soaps, tampons, vaginal sprays, or douches.  Always practice safe sex. Use a condom to prevent sexually transmitted infections (STIs).  Talk freely with your partner about your condition. General instructions  Take or apply over-the-counter and prescription medicines only as told by your health care provider.  Urinate before you have sex.  Consider joining a support group.  Get the results of any tests you have done. Ask your health care provider, or the department that is doing the procedure, when your results will be ready.  Keep all follow-up visits as told by your health care provider. This is important. Contact a health care provider if:  You have vaginal bleeding after having sex.  You develop a lump at the opening of your vagina even if the   lump is painless.  You have: ? Abnormal discharge from your vagina. ? Vaginal dryness. ? Itchiness or irritation of your vulva or vagina. ? A new rash. ? Symptoms that get worse or do not improve  with treatment. ? A fever. ? Pain when you urinate. ? Blood in your urine. Get help right away if:  You have severe pain in your abdomen during or shortly after sex.  You pass out after sex. Summary  Dyspareunia is pain that is associated with sexual activity. This can affect any part of the genitals or lower abdomen.  There are many causes of this condition. Treatment depends on the cause and your symptoms. In most cases, you may need to stop sexual activity until your symptoms improve.  Take or apply over-the-counter and prescription medicines only as told by your health care provider.  Contact a health care provider if your symptoms get worse or do not improve with treatment.  Keep all follow-up visits as told by your health care provider. This is important. This information is not intended to replace advice given to you by your health care provider. Make sure you discuss any questions you have with your health care provider. Document Revised: 06/07/2018 Document Reviewed: 06/07/2018 Elsevier Patient Education  2020 [employer] Pelvic Pain, Female Pelvic pain is pain in your lower abdomen, below your belly button and between your hips. The pain may start suddenly (be acute), keep coming back (be recurring), or last a long time (become chronic). Pelvic pain that lasts longer than 6 months is considered chronic. Pelvic pain may affect your:  Reproductive organs.  Urinary system.  Digestive tract.  Musculoskeletal system. There are many potential causes of pelvic pain. Sometimes, the pain can be a result of digestive or urinary conditions, strained muscles or ligaments, or reproductive conditions. Sometimes the cause of pelvic pain is not known. Follow these instructions at home:   Take over-the-counter and prescription medicines only as told by your health care provider.  Rest as told by your health care provider.  Do not have sex if it hurts.  Keep a journal of your  pelvic pain. Write down: ? When the pain started. ? Where the pain is located. ? What seems to make the pain better or worse, such as food or your period (menstrual cycle). ? Any symptoms you have along with the pain.  Keep all follow-up visits as told by your health care provider. This is important. Contact a health care provider if:  Medicine does not help your pain.  Your pain comes back.  You have new symptoms.  You have abnormal vaginal discharge or bleeding, including bleeding after menopause.  You have a fever or chills.  You are constipated.  You have blood in your urine or stool.  You have foul-smelling urine.  You feel weak or light-headed. Get help right away if:  You have sudden severe pain.  Your pain gets steadily worse.  You have severe pain along with fever, nausea, vomiting, or excessive sweating.  You lose consciousness. Summary  Pelvic pain is pain in your lower abdomen, below your belly button and between your hips.  There are many potential causes of pelvic pain.  Keep a journal of your pelvic pain. This information is not intended to replace advice given to you by your health care provider. Make sure you discuss any questions you have with your health care provider. Document Revised: 09/16/2017 Document Reviewed: 09/16/2017 Elsevier Patient Education  2020 [employer].

## 2019-05-09 NOTE — Progress Notes (Signed)
Pt was seen in ED and was told she has fibroids and she needed to follow up here- records under media
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
## 2019-05-10 ENCOUNTER — Ambulatory Visit (INDEPENDENT_AMBULATORY_CARE_PROVIDER_SITE_OTHER): Payer: Medicare HMO

## 2019-05-10 DIAGNOSIS — R102 Pelvic and perineal pain: Secondary | ICD-10-CM

## 2019-05-10 LAB — URINALYSIS, ROUTINE W REFLEX MICROSCOPIC
Bilirubin Urine: NEGATIVE
Glucose, UA: NEGATIVE
Hgb urine dipstick: NEGATIVE
Ketones, ur: NEGATIVE
Leukocytes,Ua: NEGATIVE
Nitrite: NEGATIVE
Protein, ur: NEGATIVE
Specific Gravity, Urine: 1.011 (ref 1.001–1.03)
pH: 6.5 (ref 5.0–8.0)

## 2019-05-10 LAB — EXTRA URINE SPECIMEN

## 2019-05-10 LAB — URINE CULTURE
MICRO NUMBER:: 10078487
SPECIMEN QUALITY:: ADEQUATE

## 2019-05-12 ENCOUNTER — Other Ambulatory Visit: Payer: Self-pay | Admitting: Obstetrics & Gynecology

## 2019-05-12 LAB — CERVICOVAGINAL ANCILLARY ONLY
Bacterial Vaginitis (gardnerella): POSITIVE — AB
Candida Glabrata: NEGATIVE
Candida Vaginitis: NEGATIVE
Chlamydia: NEGATIVE
Comment: NEGATIVE
Comment: NEGATIVE
Comment: NEGATIVE
Comment: NEGATIVE
Comment: NEGATIVE
Comment: NORMAL
Neisseria Gonorrhea: NEGATIVE
Trichomonas: NEGATIVE

## 2019-05-12 LAB — CYTOLOGY - PAP
Comment: NEGATIVE
Diagnosis: NEGATIVE
High risk HPV: POSITIVE — AB

## 2019-05-12 MED ORDER — METRONIDAZOLE 500 MG PO TABS: 500.0000 mg | ORAL_TABLET | Freq: Two times a day (BID) | ORAL | 0 refills | Status: DC

## 2019-05-13 ENCOUNTER — Encounter: Payer: Self-pay | Admitting: Obstetrics & Gynecology

## 2019-05-13 DIAGNOSIS — B977 Papillomavirus as the cause of diseases classified elsewhere: Secondary | ICD-10-CM | POA: Insufficient documentation

## 2019-05-23 ENCOUNTER — Other Ambulatory Visit: Payer: Self-pay

## 2019-05-23 ENCOUNTER — Encounter: Payer: Self-pay | Admitting: Obstetrics & Gynecology

## 2019-05-23 ENCOUNTER — Other Ambulatory Visit: Payer: Self-pay | Admitting: Obstetrics & Gynecology

## 2019-05-23 ENCOUNTER — Ambulatory Visit (INDEPENDENT_AMBULATORY_CARE_PROVIDER_SITE_OTHER): Payer: Medicare HMO | Admitting: Obstetrics & Gynecology

## 2019-05-23 VITALS — BP 148/94 | HR 107 | Wt 192.0 lb

## 2019-05-23 DIAGNOSIS — R87810 Cervical high risk human papillomavirus (HPV) DNA test positive: Secondary | ICD-10-CM

## 2019-05-23 DIAGNOSIS — M5441 Lumbago with sciatica, right side: Secondary | ICD-10-CM

## 2019-05-23 DIAGNOSIS — N938 Other specified abnormal uterine and vaginal bleeding: Secondary | ICD-10-CM

## 2019-05-23 DIAGNOSIS — N3946 Mixed incontinence: Secondary | ICD-10-CM

## 2019-05-23 DIAGNOSIS — B977 Papillomavirus as the cause of diseases classified elsewhere: Secondary | ICD-10-CM | POA: Diagnosis not present

## 2019-05-23 DIAGNOSIS — R102 Pelvic and perineal pain: Secondary | ICD-10-CM | POA: Diagnosis not present

## 2019-05-23 DIAGNOSIS — Z3202 Encounter for pregnancy test, result negative: Secondary | ICD-10-CM

## 2019-05-23 LAB — POCT URINE PREGNANCY: Preg Test, Ur: NEGATIVE

## 2019-05-23 NOTE — Progress Notes (Signed)
Subjective:    Patient ID: Sarah Moreno, female    DOB: July 23, 1980, 39 y.o.   MRN: [id number]  HPI  Pt presents to review test results and also have endometrial biopsy and colposcopy.  Patient brought her menstrual calendar and she bleeds more days and she does not.  She still has pelvic pain.  We reviewed the ultrasound and there is one posterior transmural fibroid as well as a simple ovarian cyst measuring 4.5 cm.  The lining is thin.  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  She does have a history of a LEEP with [facility].  We do not have those records and we are requesting those today.  Patient very much wants a hysterectomy and will go ahead and proceed with colposcopy today in order to expedite time to the operating room.  Patient did start taking the Neurontin and this is helped her back pain.  Patient has not had a sports medicine evaluation yet.  She does go to the urologist next week for her incontinence.  Review of Systems  Respiratory: Negative.   Cardiovascular: Negative.   Gastrointestinal: Positive for abdominal pain.  Genitourinary: Positive for pelvic pain and vaginal bleeding.  Psychiatric/Behavioral: The patient is nervous/anxious.        Objective:   Physical Exam Vitals reviewed.  Constitutional:      General: She is not in acute distress.    Appearance: She is well-developed.  HENT:     Head: Normocephalic and atraumatic.  Eyes:     Conjunctiva/sclera: Conjunctivae normal.  Cardiovascular:     Rate and Rhythm: Normal rate.  Pulmonary:     Effort: Pulmonary effort is normal.  Abdominal:     General: There is no distension.     Palpations: Abdomen is soft. There is no mass.     Tenderness: There is no abdominal tenderness. There is no guarding or rebound.  Genitourinary:    General: Normal vulva.     Comments: Vagina--pink normal rugae no bleeding Cervix--no lesion and scarred.  See colposcopy note.  Skin:    General: Skin  is warm and dry.  Neurological:     Mental Status: She is alert and oriented to person, place, and time.    Vitals:   05/23/19 1010  BP: (!) 148/94  Pulse: (!) 107  Weight: 192 lb (87.1 kg)       Assessment & Plan:  39 yo female with multiple gyn complaints and concerns  1.  Menomet and pelvic pain--US as above.  Endometrial biopsy today. 2.  HPV+ and history of LEEP--obtain old records and complete colposcopy today. 3.  Back pain--continue Neurontin and appointment with sports medicine physician. 4.  Urinary incontinence--keep appointment with urologist.  ENDOMETRIAL BIOPSY     The indications for endometrial biopsy were reviewed.   Risks of the biopsy including cramping, bleeding, infection, uterine perforation, inadequate specimen and need for additional procedures  were discussed. The patient states she understands and agrees to undergo procedure today. Consent was signed. Time out was performed. Urine HCG was negative. A sterile speculum was placed in the patient's vagina and the cervix was prepped with Betadine. A single-toothed tenaculum was placed on the anterior lip of the cervix to stabilize it. The 3 mm pipelle was introduced into the endometrial cavity without difficulty to a depth of 7.5 cm, and a moderate amount of tissue was obtained and sent to pathology. The instruments were removed from the patient's vagina.  Minimal bleeding from the cervix was noted. The patient tolerated the procedure well. Routine post-procedure instructions were given to the patient. The patient will follow up to review the results and for further management.    Colposcopy Procedure Note  Indications: Pap smear 0 months ago showed: HPV+. The prior pap showed unknown--getting records.  Prior cervical/vaginal disease: cervical dysplasia needing LEEP at [facility]. Prior cervical treatment: LEEP.  Procedure Details  The risks and benefits of the procedure and Written informed consent  obtained.  Speculum placed in vagina and excellent visualization of cervix achieved, cervix swabbed x 3 with acetic acid solution.  Findings: Cervix: no visible lesions and T ZONE not seen due to scaring from prior LEEP; cervix swabbed with Lugol's solution, endocervical curettage performed and endometrial biopsy performed. Vaginal inspection: vaginal colposcopy not performed. Vulvar colposcopy: vulvar colposcopy not performed.  Specimens: ECC and endometrial biopsy  Complications: none.  Plan: Specimens labelled and sent to Pathology. Will base further treatment on Pathology findings. Post biopsy instructions given to patient.

## 2019-05-27 ENCOUNTER — Encounter: Payer: Self-pay | Admitting: Allergy

## 2019-06-09 ENCOUNTER — Other Ambulatory Visit: Payer: Self-pay

## 2019-06-09 ENCOUNTER — Ambulatory Visit (INDEPENDENT_AMBULATORY_CARE_PROVIDER_SITE_OTHER): Payer: Medicare HMO | Admitting: Obstetrics & Gynecology

## 2019-06-09 ENCOUNTER — Encounter: Payer: Self-pay | Admitting: Obstetrics & Gynecology

## 2019-06-09 VITALS — BP 125/78 | HR 92 | Temp 97.4°F | Resp 16 | Ht 63.0 in | Wt 195.0 lb

## 2019-06-09 DIAGNOSIS — G8929 Other chronic pain: Secondary | ICD-10-CM | POA: Diagnosis not present

## 2019-06-09 DIAGNOSIS — R102 Pelvic and perineal pain: Secondary | ICD-10-CM

## 2019-06-09 NOTE — Progress Notes (Signed)
History:  39 y.o. G3P2100 here today for surgical consult. Pt was evaluated by Dr. Penne Lash and determined to need a hysterectomy for pelvic pain and she is here to discuss options. The pt has had a PAP, Colposcopy and endo bx. She reports a h/o incontinence and she was seen at [facility] earlier today. The pt reports that they do not think that she needs a urologic procedure but, I have not seen those records yet. Pt has bleeding after intercourse.  Otherwise had monthly menses. Pt has dysmenorrhea.  She does not take anything for it--does not like to take pills.   Uses heating pad.  Pt has remote history of substance abuse.  The following portions of the patient's history were reviewed and updated as appropriate: allergies, current medications, past family history, past medical history, past social history, past surgical history and problem list.  Review of Systems:  Pertinent items are noted in HPI.    Objective:  Physical Exam Blood pressure 125/78, pulse 92, temperature (!) 97.4 F (36.3 C), resp. rate 16, height 5\' 3"  (1.6 m), weight 195 lb (88.5 kg), last menstrual period 05/27/2019.  CONSTITUTIONAL: Well-developed, well-nourished female in no acute distress.  HENT:  Normocephalic, atraumatic EYES: Conjunctivae and EOM are normal. No scleral icterus.  NECK: Normal range of motion SKIN: Skin is warm and dry. No rash noted. Not diaphoretic.No pallor. NEUROLGIC: Alert and oriented to person, place, and time. Normal coordination.  Abd: Soft, nontender and nondistended; there is a small well healed infraumbilical incision. The abd is soft, NT, ND Pelvic: Normal appearing external genitalia; normal appearing vaginal mucosa and cervix.  Normal discharge.  Small uterus, no other palpable masses, no uterine or adnexal tenderness  Labs and Imaging  05/10/2019 CLINICAL DATA:  Pelvic, vaginal, and back pain for 3 weeks, worsening, history of LEEP, tubal ligation, hernia repair,  fibroids based on prior outside CT  EXAM: TRANSABDOMINAL AND TRANSVAGINAL ULTRASOUND OF PELVIS  TECHNIQUE: Both transabdominal and transvaginal ultrasound examinations of the pelvis were performed. Transabdominal technique was performed for global imaging of the pelvis including uterus, ovaries, adnexal regions, and pelvic cul-de-sac. It was necessary to proceed with endovaginal exam following the transabdominal exam to visualize the endometrium and RIGHT ovary.  COMPARISON:  None  FINDINGS: Uterus  Measurements: 9.5 x 5.2 x 6.1 cm = volume: 157 mL. Anteverted. Suspected transmural leiomyoma subtly visualized at the posterior upper uterus 3.7 x 2.2 x 3.4 cm. No other focal uterine masses.  Endometrium  Thickness: 5 mm.  No endometrial fluid or focal abnormality  Right ovary  Measurements: 4.7 x 2.3 x 4.4 cm = volume: 225 mL. Simple cyst RIGHT ovary 4.5 x 2.1 x 3.9 cm  Left ovary  Measurements: 3.6 x 1.5 x 1.9 cm = volume: 5 mL. Normal morphology without mass  Other findings  Numerous bowel loops in pelvis. Minimally prominent RIGHT adnexal vessels. No free fluid or additional adnexal masses.  IMPRESSION: Suspected subtle transmural leiomyoma at the posterior upper uterus 3.7 cm greatest size.  Simple cyst RIGHT ovary 4.5 cm greatest diameter.  05/23/2019 Diagnosis 1. Endometrium, biopsy - PROLIFERATIVE ENDOMETRIUM. NO HYPERPLASIA OR MALIGNANCY. - INCIDENTAL FRAGMENTS OF BENIGN ENDOCERVICAL GLANDULAR MUCOSA. 2. Endocervix, curettage - FRAGMENTS OF BENIGN SQUAMOUS AND ENDOCERVICAL GLANDULAR MUCOSA. - NO ATYPIA, DYSPLASIA, OR MALIGNANCY.    Assessment & Plan:  Pelvic pain- I have reviewed with the pt that if her pain is NOT related to the uteus, the surgery will not help with her pain. I have  also reviewed that I MUST get her records from urology prior to performing the case. Pt would like to proceed with scheduling the case as we wait for the  records to arrive.   Patient desires surgical management with LAVH with bilateral salpingectomy.  The risks of surgery were discussed in detail with the patient including but not limited to: bleeding which may require transfusion or reoperation; infection which may require prolonged hospitalization or re-hospitalization and antibiotic therapy; injury to bowel, bladder, ureters and major vessels or other surrounding organs; need for additional procedures including laparotomy; thromboembolic phenomenon, incisional problems and other postoperative or anesthesia complications.  Patient was told that the likelihood that her condition and symptoms will be treated effectively with this surgical management was very high; the postoperative expectations were also discussed in detail. The patient also understands the alternative treatment options which were discussed in full. All questions were answered.  She was told that she will be contacted by our surgical scheduler regarding the time and date of her surgery; routine preoperative instructions of having nothing to eat or drink after midnight on the day prior to surgery and also coming to the hospital 1 1/2 hours prior to her time of surgery were also emphasized.  She was told she may be called for a preoperative appointment about a week prior to surgery and will be given further preoperative instructions at that visit. Printed patient education handouts about the procedure were given to the patient to review at home.  Jetty Berland L. Harraway-Smith, M.D., Cherlynn June

## 2019-06-10 ENCOUNTER — Encounter: Payer: Self-pay | Admitting: *Deleted

## 2019-08-30 ENCOUNTER — Other Ambulatory Visit: Payer: Self-pay

## 2019-08-30 ENCOUNTER — Emergency Department (HOSPITAL_COMMUNITY): Payer: Medicare HMO

## 2019-08-30 ENCOUNTER — Emergency Department (HOSPITAL_COMMUNITY)
Admission: EM | Admit: 2019-08-30 | Discharge: 2019-08-30 | Disposition: A | Discharge status: Home / Self Care | Payer: Medicare HMO | Attending: Emergency Medicine | Admitting: Emergency Medicine

## 2019-08-30 ENCOUNTER — Encounter (HOSPITAL_COMMUNITY): Payer: Self-pay

## 2019-08-30 DIAGNOSIS — R079 Chest pain, unspecified: Secondary | ICD-10-CM | POA: Insufficient documentation

## 2019-08-30 DIAGNOSIS — Z5321 Procedure and treatment not carried out due to patient leaving prior to being seen by health care provider: Secondary | ICD-10-CM | POA: Diagnosis not present

## 2019-08-30 HISTORY — DX: Unspecified convulsions: R56.9

## 2019-08-30 LAB — CBC
HCT: 38 % (ref 36.0–46.0)
Hemoglobin: 11.8 g/dL — ABNORMAL LOW (ref 12.0–15.0)
MCH: 27.1 pg (ref 26.0–34.0)
MCHC: 31.1 g/dL (ref 30.0–36.0)
MCV: 87.4 fL (ref 80.0–100.0)
Platelets: 391 10*3/uL (ref 150–400)
RBC: 4.35 MIL/uL (ref 3.87–5.11)
RDW: 14.6 % (ref 11.5–15.5)
WBC: 11.2 10*3/uL — ABNORMAL HIGH (ref 4.0–10.5)
nRBC: 0 % (ref 0.0–0.2)

## 2019-08-30 LAB — BASIC METABOLIC PANEL
Anion gap: 13 (ref 5–15)
BUN: 11 mg/dL (ref 6–20)
CO2: 25 mmol/L (ref 22–32)
Calcium: 9.5 mg/dL (ref 8.9–10.3)
Chloride: 101 mmol/L (ref 98–111)
Creatinine, Ser: 1.04 mg/dL — ABNORMAL HIGH (ref 0.44–1.00)
GFR calc Af Amer: >60 mL/min (ref >60)
GFR calc non Af Amer: >60 mL/min (ref >60)
Glucose, Bld: 118 mg/dL — ABNORMAL HIGH (ref 70–99)
Potassium: 4 mmol/L (ref 3.5–5.1)
Sodium: 139 mmol/L (ref 135–145)

## 2019-08-30 LAB — I-STAT BETA HCG BLOOD, ED (MC, WL, AP ONLY): I-stat hCG, quantitative: <5 m[IU]/mL (ref <5)

## 2019-08-30 LAB — TROPONIN I (HIGH SENSITIVITY): Troponin I (High Sensitivity): 3 ng/L (ref <18)

## 2019-08-30 MED ORDER — SODIUM CHLORIDE 0.9% FLUSH: 3.0000 mL | Freq: Once | INTRAVENOUS | Status: DC

## 2019-08-30 NOTE — ED Notes (Signed)
Pt called multiple times, no answer 

## 2019-08-30 NOTE — ED Triage Notes (Signed)
Pt arrives to ED w/ c/o chest pain, sob. Pt reports 7/10 chest pain described as pressure radiating down left arm. Pt states she had the same symptoms 3 weeks ago and had a seizures. No hx of seizures outside of that episode.

## 2019-09-01 NOTE — Patient Instructions (Addendum)
YOU ARE SCHEDULED FOR A COVID TEST ___5-28-21 ______@_____0915_______ . THIS TEST MUST BE DONE BEFORE SURGERY. GO TO  [address], , [address] AND REMAIN IN YOUR CAR, THIS IS A DRIVE UP TEST. ONCE YOUR COVID TEST IS DONE PLEASE FOLLOW ALL THE QUARANTINE  INSTRUCTIONS GIVEN IN YOUR HANDOUT.      Your procedure is scheduled on  09-13-19   Report to [facility] AT      0530  A. M.   Call this number if you have problems the morning of surgery  :[phone].   OUR ADDRESS IS [address].  WE ARE LOCATED IN [facility].                                     REMEMBER:  DO NOT EAT FOOD OR DRINK LIQUIDS AFTER MIDNIGHT .  YOU MAY  BRUSH YOUR TEETH MORNING OF SURGERY AND RINSE YOUR MOUTH OUT, NO CHEWING GUM CANDY OR MINTS.   TAKE THESE MEDICATIONS MORNING OF SURGERY WITH A SIP OF WATER:  Inhaler, nebulizer if needed,prozac,omeprazole, detrol LA __________________________________  IF YOU ARE SPENDING THE NIGHT AFTER SURGERY PLEASE BRING ALL YOUR PRESCRIPTION MEDICATIONS IN THEIR ORIGINAL BOTTLES. 1 VISITOR IS ALLOWED IN WAITING ROOM ONLY DAY OF SURGERY. NO VISITOR MAY SPEND THE NIGHT. VISITOR ARE ALLOWED TO STAY UNTIL 800 PM.                                    DO NOT WEAR JEWERLY, MAKE UP, OR NAIL POLISH ON FINGERNAILS. DO NOT WEAR LOTIONS, POWDERS, PERFUMES OR DEODORANT. DO NOT SHAVE FOR 24 HOURS PRIOR TO DAY OF SURGERY.  CONTACTS, GLASSES, OR DENTURES MAY NOT BE WORN TO SURGERY.                                    [facility] IS NOT RESPONSIBLE  FOR ANY BELONGINGS.                                                                    Marland Kitchen         [place] - Preparing for Surgery Before surgery, you can play an important role.  Because skin is not sterile, your skin needs to be as free of germs as possible.  You can reduce the number of germs on your skin by washing with CHG (chlorahexidine gluconate) soap before surgery.  CHG is an antiseptic  cleaner which kills germs and bonds with the skin to continue killing germs even after washing. Please DO NOT use if you have an allergy to CHG or antibacterial soaps.  If your skin becomes reddened/irritated stop using the CHG and inform your nurse when you arrive at Short Stay. Do not shave (including legs and underarms) for at least 48 hours prior to the first CHG shower.  You may shave your face/neck. Please follow these instructions carefully:  1.  Shower with CHG Soap the night before surgery and the  morning of  Surgery.  2.  If you choose to wash your hair, wash your hair first as usual with your  normal  shampoo.  3.  After you shampoo, rinse your hair and body thoroughly to remove the  shampoo.                           4.  Use CHG as you would any other liquid soap.  You can apply chg directly  to the skin and wash                       Gently with a scrungie or clean washcloth.  5.  Apply the CHG Soap to your body ONLY FROM THE NECK DOWN.   Do not use on face/ open                           Wound or open sores. Avoid contact with eyes, ears mouth and genitals (private parts).                       Wash face,  Genitals (private parts) with your normal soap.             6.  Wash thoroughly, paying special attention to the area where your surgery  will be performed.  7.  Thoroughly rinse your body with warm water from the neck down.  8.  DO NOT shower/wash with your normal soap after using and rinsing off  the CHG Soap.                9.  Pat yourself dry with a clean towel.            10.  Wear clean pajamas.            11.  Place clean sheets on your bed the night of your first shower and do not  sleep with pets. Day of Surgery : Do not apply any lotions/deodorants the morning of surgery.  Please wear clean clothes to the hospital/surgery center.  FAILURE TO FOLLOW THESE INSTRUCTIONS MAY RESULT IN THE CANCELLATION OF YOUR SURGERY PATIENT  SIGNATURE_________________________________  NURSE SIGNATURE__________________________________  ________________________________________________________________________

## 2019-09-02 ENCOUNTER — Encounter: Payer: Self-pay | Admitting: *Deleted

## 2019-09-02 ENCOUNTER — Encounter (HOSPITAL_COMMUNITY)
Admission: RE | Admit: 2019-09-02 | Discharge: 2019-09-02 | Disposition: A | Discharge status: Home / Self Care | Payer: Medicare HMO | Source: Ambulatory Visit | Attending: Obstetrics & Gynecology | Admitting: Obstetrics & Gynecology

## 2019-09-02 ENCOUNTER — Encounter (HOSPITAL_COMMUNITY): Payer: Self-pay

## 2019-09-02 ENCOUNTER — Other Ambulatory Visit: Payer: Self-pay

## 2019-09-02 DIAGNOSIS — R079 Chest pain, unspecified: Secondary | ICD-10-CM | POA: Diagnosis not present

## 2019-09-02 HISTORY — DX: Other abnormal glucose: R73.09

## 2019-09-02 LAB — CBC
HCT: 34.7 % — ABNORMAL LOW (ref 36.0–46.0)
Hemoglobin: 10.8 g/dL — ABNORMAL LOW (ref 12.0–15.0)
MCH: 27.3 pg (ref 26.0–34.0)
MCHC: 31.1 g/dL (ref 30.0–36.0)
MCV: 87.8 fL (ref 80.0–100.0)
Platelets: 350 10*3/uL (ref 150–400)
RBC: 3.95 MIL/uL (ref 3.87–5.11)
RDW: 14.7 % (ref 11.5–15.5)
WBC: 11 10*3/uL — ABNORMAL HIGH (ref 4.0–10.5)
nRBC: 0 % (ref 0.0–0.2)

## 2019-09-02 LAB — TYPE AND SCREEN
ABO/RH(D): AB POS
Antibody Screen: NEGATIVE

## 2019-09-03 LAB — ABO/RH: ABO/RH(D): AB POS

## 2019-09-09 ENCOUNTER — Other Ambulatory Visit (HOSPITAL_COMMUNITY)
Admission: RE | Admit: 2019-09-09 | Discharge: 2019-09-09 | Disposition: A | Discharge status: Home / Self Care | Payer: Medicare HMO | Source: Ambulatory Visit | Attending: Obstetrics & Gynecology | Admitting: Obstetrics & Gynecology

## 2019-09-09 DIAGNOSIS — Z20822 Contact with and (suspected) exposure to covid-19: Secondary | ICD-10-CM | POA: Diagnosis not present

## 2019-09-09 DIAGNOSIS — Z01812 Encounter for preprocedural laboratory examination: Secondary | ICD-10-CM | POA: Diagnosis present

## 2019-09-09 LAB — SARS CORONAVIRUS 2 (TAT 6-24 HRS): SARS Coronavirus 2: NEGATIVE

## 2019-09-13 ENCOUNTER — Ambulatory Visit (HOSPITAL_BASED_OUTPATIENT_CLINIC_OR_DEPARTMENT_OTHER)
Admission: RE | Admit: 2019-09-13 | Discharge: 2019-09-13 | Disposition: A | Discharge status: Home / Self Care | Payer: Medicare HMO | Attending: Obstetrics & Gynecology | Admitting: Obstetrics & Gynecology

## 2019-09-13 ENCOUNTER — Observation Stay (HOSPITAL_BASED_OUTPATIENT_CLINIC_OR_DEPARTMENT_OTHER): Payer: Medicare HMO | Admitting: Anesthesiology

## 2019-09-13 ENCOUNTER — Encounter (HOSPITAL_BASED_OUTPATIENT_CLINIC_OR_DEPARTMENT_OTHER): Payer: Self-pay | Admitting: Obstetrics & Gynecology

## 2019-09-13 ENCOUNTER — Other Ambulatory Visit: Payer: Self-pay

## 2019-09-13 ENCOUNTER — Encounter (HOSPITAL_BASED_OUTPATIENT_CLINIC_OR_DEPARTMENT_OTHER)
Admission: RE | Disposition: A | Discharge status: Home / Self Care | Payer: Self-pay | Source: Home / Self Care | Attending: Obstetrics & Gynecology

## 2019-09-13 DIAGNOSIS — D649 Anemia, unspecified: Secondary | ICD-10-CM | POA: Insufficient documentation

## 2019-09-13 DIAGNOSIS — Z825 Family history of asthma and other chronic lower respiratory diseases: Secondary | ICD-10-CM | POA: Insufficient documentation

## 2019-09-13 DIAGNOSIS — F319 Bipolar disorder, unspecified: Secondary | ICD-10-CM | POA: Insufficient documentation

## 2019-09-13 DIAGNOSIS — G8929 Other chronic pain: Secondary | ICD-10-CM | POA: Diagnosis present

## 2019-09-13 DIAGNOSIS — F4310 Post-traumatic stress disorder, unspecified: Secondary | ICD-10-CM | POA: Diagnosis not present

## 2019-09-13 DIAGNOSIS — Z803 Family history of malignant neoplasm of breast: Secondary | ICD-10-CM | POA: Insufficient documentation

## 2019-09-13 DIAGNOSIS — Z7951 Long term (current) use of inhaled steroids: Secondary | ICD-10-CM | POA: Diagnosis not present

## 2019-09-13 DIAGNOSIS — B977 Papillomavirus as the cause of diseases classified elsewhere: Secondary | ICD-10-CM | POA: Diagnosis not present

## 2019-09-13 DIAGNOSIS — J45909 Unspecified asthma, uncomplicated: Secondary | ICD-10-CM | POA: Insufficient documentation

## 2019-09-13 DIAGNOSIS — Z818 Family history of other mental and behavioral disorders: Secondary | ICD-10-CM | POA: Diagnosis not present

## 2019-09-13 DIAGNOSIS — G43909 Migraine, unspecified, not intractable, without status migrainosus: Secondary | ICD-10-CM | POA: Diagnosis not present

## 2019-09-13 DIAGNOSIS — Z888 Allergy status to other drugs, medicaments and biological substances status: Secondary | ICD-10-CM | POA: Insufficient documentation

## 2019-09-13 DIAGNOSIS — R87810 Cervical high risk human papillomavirus (HPV) DNA test positive: Secondary | ICD-10-CM | POA: Diagnosis not present

## 2019-09-13 DIAGNOSIS — N838 Other noninflammatory disorders of ovary, fallopian tube and broad ligament: Secondary | ICD-10-CM | POA: Diagnosis not present

## 2019-09-13 DIAGNOSIS — Z79899 Other long term (current) drug therapy: Secondary | ICD-10-CM | POA: Insufficient documentation

## 2019-09-13 DIAGNOSIS — Z880 Allergy status to penicillin: Secondary | ICD-10-CM | POA: Diagnosis not present

## 2019-09-13 DIAGNOSIS — J4540 Moderate persistent asthma, uncomplicated: Secondary | ICD-10-CM | POA: Diagnosis not present

## 2019-09-13 DIAGNOSIS — F39 Unspecified mood [affective] disorder: Secondary | ICD-10-CM | POA: Diagnosis not present

## 2019-09-13 DIAGNOSIS — K219 Gastro-esophageal reflux disease without esophagitis: Secondary | ICD-10-CM | POA: Insufficient documentation

## 2019-09-13 DIAGNOSIS — F909 Attention-deficit hyperactivity disorder, unspecified type: Secondary | ICD-10-CM | POA: Diagnosis not present

## 2019-09-13 DIAGNOSIS — F419 Anxiety disorder, unspecified: Secondary | ICD-10-CM | POA: Diagnosis not present

## 2019-09-13 DIAGNOSIS — Z9889 Other specified postprocedural states: Secondary | ICD-10-CM

## 2019-09-13 DIAGNOSIS — Z8049 Family history of malignant neoplasm of other genital organs: Secondary | ICD-10-CM | POA: Insufficient documentation

## 2019-09-13 DIAGNOSIS — Z87891 Personal history of nicotine dependence: Secondary | ICD-10-CM | POA: Insufficient documentation

## 2019-09-13 DIAGNOSIS — R102 Pelvic and perineal pain: Secondary | ICD-10-CM | POA: Insufficient documentation

## 2019-09-13 HISTORY — PX: LAPAROSCOPIC VAGINAL HYSTERECTOMY WITH SALPINGECTOMY: SHX6680

## 2019-09-13 SURGERY — HYSTERECTOMY, VAGINAL, LAPAROSCOPY-ASSISTED, WITH SALPINGECTOMY
Anesthesia: General | Site: Abdomen | Laterality: Bilateral

## 2019-09-13 MED ORDER — MIDAZOLAM HCL 2 MG/2ML IJ SOLN
INTRAMUSCULAR | Status: AC
  Filled 2019-09-13: qty 2

## 2019-09-13 MED ORDER — DOCUSATE SODIUM 100 MG PO CAPS: 100.0000 mg | ORAL_CAPSULE | Freq: Two times a day (BID) | ORAL | 0 refills | Status: DC

## 2019-09-13 MED ORDER — CHLORHEXIDINE GLUCONATE 0.12 % MT SOLN: 15.0000 mL | Freq: Once | OROMUCOSAL | Status: DC

## 2019-09-13 MED ORDER — KETOROLAC TROMETHAMINE 30 MG/ML IJ SOLN
INTRAMUSCULAR | Status: DC | PRN
  Administered 2019-09-13: 30 mg via INTRAVENOUS

## 2019-09-13 MED ORDER — FENTANYL CITRATE (PF) 250 MCG/5ML IJ SOLN
INTRAMUSCULAR | Status: AC
  Filled 2019-09-13: qty 5

## 2019-09-13 MED ORDER — LACTATED RINGERS IV SOLN: INTRAVENOUS | Status: DC

## 2019-09-13 MED ORDER — ONDANSETRON HCL 4 MG/2ML IJ SOLN: 4.0000 mg | Freq: Four times a day (QID) | INTRAMUSCULAR | Status: DC | PRN

## 2019-09-13 MED ORDER — IBUPROFEN 800 MG PO TABS: 800.0000 mg | ORAL_TABLET | Freq: Four times a day (QID) | ORAL | 0 refills | Status: AC

## 2019-09-13 MED ORDER — ASPIRIN EC 81 MG PO TBEC
81.0000 mg | DELAYED_RELEASE_TABLET | ORAL | Status: DC | PRN
  Filled 2019-09-13: qty 1

## 2019-09-13 MED ORDER — IBUPROFEN 800 MG PO TABS: 800.0000 mg | ORAL_TABLET | Freq: Four times a day (QID) | ORAL | Status: DC

## 2019-09-13 MED ORDER — FENTANYL CITRATE (PF) 100 MCG/2ML IJ SOLN
INTRAMUSCULAR | Status: DC | PRN
  Administered 2019-09-13: 100 ug via INTRAVENOUS
  Administered 2019-09-13 (×2): 50 ug via INTRAVENOUS

## 2019-09-13 MED ORDER — DEXAMETHASONE SODIUM PHOSPHATE 10 MG/ML IJ SOLN
INTRAMUSCULAR | Status: AC
  Filled 2019-09-13: qty 1

## 2019-09-13 MED ORDER — PROPOFOL 10 MG/ML IV BOLUS
INTRAVENOUS | Status: DC | PRN
  Administered 2019-09-13: 200 mg via INTRAVENOUS

## 2019-09-13 MED ORDER — PROPOFOL 10 MG/ML IV BOLUS
INTRAVENOUS | Status: AC
  Filled 2019-09-13: qty 40

## 2019-09-13 MED ORDER — DEXAMETHASONE SODIUM PHOSPHATE 4 MG/ML IJ SOLN
INTRAMUSCULAR | Status: DC | PRN
  Administered 2019-09-13: 10 mg via INTRAVENOUS

## 2019-09-13 MED ORDER — DOCUSATE SODIUM 100 MG PO CAPS
ORAL_CAPSULE | ORAL | Status: AC
  Filled 2019-09-13: qty 1

## 2019-09-13 MED ORDER — DOCUSATE SODIUM 100 MG PO CAPS
100.0000 mg | ORAL_CAPSULE | Freq: Two times a day (BID) | ORAL | Status: DC
  Administered 2019-09-13: 100 mg via ORAL

## 2019-09-13 MED ORDER — GABAPENTIN 300 MG PO CAPS
ORAL_CAPSULE | ORAL | Status: AC
  Filled 2019-09-13: qty 1

## 2019-09-13 MED ORDER — FENTANYL CITRATE (PF) 100 MCG/2ML IJ SOLN
INTRAMUSCULAR | Status: AC
  Filled 2019-09-13: qty 2

## 2019-09-13 MED ORDER — DEXTROSE-NACL 5-0.45 % IV SOLN: INTRAVENOUS | Status: AC

## 2019-09-13 MED ORDER — ROCURONIUM BROMIDE 10 MG/ML (PF) SYRINGE
PREFILLED_SYRINGE | INTRAVENOUS | Status: AC
  Filled 2019-09-13: qty 10

## 2019-09-13 MED ORDER — PANTOPRAZOLE SODIUM 40 MG PO TBEC
DELAYED_RELEASE_TABLET | ORAL | Status: AC
  Filled 2019-09-13: qty 1

## 2019-09-13 MED ORDER — SUGAMMADEX SODIUM 200 MG/2ML IV SOLN
INTRAVENOUS | Status: DC | PRN
  Administered 2019-09-13: 180 mg via INTRAVENOUS

## 2019-09-13 MED ORDER — TRAMADOL HCL 50 MG PO TABS
50.0000 mg | ORAL_TABLET | Freq: Four times a day (QID) | ORAL | Status: DC | PRN
  Administered 2019-09-13: 50 mg via ORAL

## 2019-09-13 MED ORDER — SOD CITRATE-CITRIC ACID 500-334 MG/5ML PO SOLN: 30.0000 mL | ORAL | Status: DC

## 2019-09-13 MED ORDER — TRAMADOL HCL 50 MG PO TABS
ORAL_TABLET | ORAL | Status: AC
  Filled 2019-09-13: qty 1

## 2019-09-13 MED ORDER — SIMETHICONE 80 MG PO CHEW
80.0000 mg | CHEWABLE_TABLET | Freq: Four times a day (QID) | ORAL | Status: DC | PRN
  Administered 2019-09-13: 80 mg via ORAL

## 2019-09-13 MED ORDER — ONDANSETRON HCL 4 MG/2ML IJ SOLN: 4.0000 mg | Freq: Once | INTRAMUSCULAR | Status: DC | PRN

## 2019-09-13 MED ORDER — OXYCODONE-ACETAMINOPHEN 5-325 MG PO TABS: 1.0000 | ORAL_TABLET | ORAL | Status: DC | PRN

## 2019-09-13 MED ORDER — ORAL CARE MOUTH RINSE: 15.0000 mL | Freq: Once | OROMUCOSAL | Status: DC

## 2019-09-13 MED ORDER — PANTOPRAZOLE SODIUM 40 MG PO TBEC
40.0000 mg | DELAYED_RELEASE_TABLET | Freq: Every day | ORAL | Status: DC
  Administered 2019-09-13: 40 mg via ORAL

## 2019-09-13 MED ORDER — KETOROLAC TROMETHAMINE 30 MG/ML IJ SOLN: 30.0000 mg | Freq: Once | INTRAMUSCULAR | Status: DC

## 2019-09-13 MED ORDER — ONDANSETRON HCL 4 MG/2ML IJ SOLN
INTRAMUSCULAR | Status: AC
  Filled 2019-09-13: qty 2

## 2019-09-13 MED ORDER — ONDANSETRON HCL 4 MG PO TABS: 4.0000 mg | ORAL_TABLET | Freq: Four times a day (QID) | ORAL | Status: DC | PRN

## 2019-09-13 MED ORDER — SODIUM CHLORIDE 0.9 % IR SOLN
Status: DC | PRN
  Administered 2019-09-13: 1000 mL

## 2019-09-13 MED ORDER — GENTAMICIN SULFATE 40 MG/ML IJ SOLN
450.0000 mg | INTRAVENOUS | Status: AC
  Administered 2019-09-13: 450 mg via INTRAVENOUS
  Filled 2019-09-13: qty 11.25

## 2019-09-13 MED ORDER — CLINDAMYCIN PHOSPHATE 900 MG/50ML IV SOLN
INTRAVENOUS | Status: AC
  Filled 2019-09-13: qty 50

## 2019-09-13 MED ORDER — KETOROLAC TROMETHAMINE 30 MG/ML IJ SOLN
INTRAMUSCULAR | Status: AC
  Filled 2019-09-13: qty 1

## 2019-09-13 MED ORDER — HYDROMORPHONE HCL 1 MG/ML IJ SOLN
INTRAMUSCULAR | Status: AC
  Filled 2019-09-13: qty 1

## 2019-09-13 MED ORDER — MENTHOL 3 MG MT LOZG: 1.0000 | LOZENGE | OROMUCOSAL | Status: DC | PRN

## 2019-09-13 MED ORDER — BUPIVACAINE HCL (PF) 0.5 % IJ SOLN
INTRAMUSCULAR | Status: DC | PRN
  Administered 2019-09-13: 30 mL

## 2019-09-13 MED ORDER — OXYCODONE HCL 5 MG PO TABS: 5.0000 mg | ORAL_TABLET | Freq: Once | ORAL | Status: DC | PRN

## 2019-09-13 MED ORDER — LIDOCAINE HCL (CARDIAC) PF 100 MG/5ML IV SOSY
PREFILLED_SYRINGE | INTRAVENOUS | Status: DC | PRN
  Administered 2019-09-13: 60 mg via INTRAVENOUS

## 2019-09-13 MED ORDER — MIDAZOLAM HCL 5 MG/5ML IJ SOLN
INTRAMUSCULAR | Status: DC | PRN
  Administered 2019-09-13: 2 mg via INTRAVENOUS

## 2019-09-13 MED ORDER — HYDROMORPHONE HCL 1 MG/ML IJ SOLN
0.2000 mg | INTRAMUSCULAR | Status: DC | PRN
  Administered 2019-09-13: 0.25 mg via INTRAVENOUS

## 2019-09-13 MED ORDER — ACETAMINOPHEN 500 MG PO TABS
1000.0000 mg | ORAL_TABLET | ORAL | Status: AC
  Administered 2019-09-13: 1000 mg via ORAL

## 2019-09-13 MED ORDER — ACETAMINOPHEN 500 MG PO TABS
ORAL_TABLET | ORAL | Status: AC
  Filled 2019-09-13: qty 2

## 2019-09-13 MED ORDER — ONDANSETRON HCL 4 MG/2ML IJ SOLN
INTRAMUSCULAR | Status: DC | PRN
  Administered 2019-09-13: 4 mg via INTRAVENOUS

## 2019-09-13 MED ORDER — FENTANYL CITRATE (PF) 100 MCG/2ML IJ SOLN
25.0000 ug | INTRAMUSCULAR | Status: DC | PRN
  Administered 2019-09-13 (×3): 25 ug via INTRAVENOUS

## 2019-09-13 MED ORDER — KETOROLAC TROMETHAMINE 30 MG/ML IJ SOLN
30.0000 mg | Freq: Four times a day (QID) | INTRAMUSCULAR | Status: DC
  Administered 2019-09-13: 30 mg via INTRAVENOUS

## 2019-09-13 MED ORDER — VASOPRESSIN 20 UNIT/ML IV SOLN
INTRAVENOUS | Status: DC | PRN
  Administered 2019-09-13: 20 mL via INTRAMUSCULAR

## 2019-09-13 MED ORDER — OXYCODONE-ACETAMINOPHEN 5-325 MG PO TABS: 1.0000 | ORAL_TABLET | ORAL | 0 refills | Status: AC | PRN

## 2019-09-13 MED ORDER — ZOLPIDEM TARTRATE 5 MG PO TABS: 5.0000 mg | ORAL_TABLET | Freq: Every evening | ORAL | Status: DC | PRN

## 2019-09-13 MED ORDER — SIMETHICONE 80 MG PO CHEW
CHEWABLE_TABLET | ORAL | Status: AC
  Filled 2019-09-13: qty 1

## 2019-09-13 MED ORDER — LIDOCAINE 2% (20 MG/ML) 5 ML SYRINGE
INTRAMUSCULAR | Status: AC
  Filled 2019-09-13: qty 5

## 2019-09-13 MED ORDER — OXYCODONE HCL 5 MG/5ML PO SOLN: 5.0000 mg | Freq: Once | ORAL | Status: DC | PRN

## 2019-09-13 MED ORDER — POLYETHYLENE GLYCOL 3350 17 G PO PACK: 17.0000 g | PACK | Freq: Every day | ORAL | Status: DC | PRN

## 2019-09-13 MED ORDER — CLINDAMYCIN PHOSPHATE 900 MG/50ML IV SOLN
900.0000 mg | INTRAVENOUS | Status: AC
  Administered 2019-09-13: 900 mg via INTRAVENOUS

## 2019-09-13 MED ORDER — BISACODYL 10 MG RE SUPP: 10.0000 mg | Freq: Every day | RECTAL | Status: DC | PRN

## 2019-09-13 MED ORDER — ROCURONIUM BROMIDE 100 MG/10ML IV SOLN
INTRAVENOUS | Status: DC | PRN
  Administered 2019-09-13: 10 mg via INTRAVENOUS
  Administered 2019-09-13: 60 mg via INTRAVENOUS

## 2019-09-13 MED ORDER — GABAPENTIN 300 MG PO CAPS
300.0000 mg | ORAL_CAPSULE | ORAL | Status: AC
  Administered 2019-09-13: 300 mg via ORAL

## 2019-09-13 SURGICAL SUPPLY — 61 items
APPLICATOR ARISTA FLEXITIP XL (MISCELLANEOUS) ×2 IMPLANT
APPLICATOR COTTON TIP 6 STRL (MISCELLANEOUS) IMPLANT
APPLICATOR COTTON TIP 6IN STRL (MISCELLANEOUS) ×3
BNDG GAUZE ELAST 4 BULKY (GAUZE/BANDAGES/DRESSINGS) ×1 IMPLANT
CABLE HIGH FREQUENCY MONO STRZ (ELECTRODE) IMPLANT
COVER BACK TABLE 60X90IN (DRAPES) ×3 IMPLANT
COVER MAYO STAND STRL (DRAPES) ×5 IMPLANT
DECANTER SPIKE VIAL GLASS SM (MISCELLANEOUS) IMPLANT
DERMABOND ADVANCED (GAUZE/BANDAGES/DRESSINGS) ×4
DERMABOND ADVANCED .7 DNX12 (GAUZE/BANDAGES/DRESSINGS) IMPLANT
DRSG OPSITE POSTOP 3X4 (GAUZE/BANDAGES/DRESSINGS) IMPLANT
DURAPREP 26ML APPLICATOR (WOUND CARE) ×3 IMPLANT
ELECT REM PT RETURN 9FT ADLT (ELECTROSURGICAL) ×3
ELECTRODE REM PT RTRN 9FT ADLT (ELECTROSURGICAL) ×1 IMPLANT
GAUZE PACKING 2X5 YD STRL (GAUZE/BANDAGES/DRESSINGS) IMPLANT
GLOVE BIO SURGEON STRL SZ7 (GLOVE) ×6 IMPLANT
GLOVE BIOGEL PI IND STRL 6.5 (GLOVE) ×1 IMPLANT
GLOVE BIOGEL PI IND STRL 7.0 (GLOVE) ×5 IMPLANT
GLOVE BIOGEL PI INDICATOR 6.5 (GLOVE) ×2
GLOVE BIOGEL PI INDICATOR 7.0 (GLOVE) ×10
GOWN STRL REUS W/ TWL XL LVL3 (GOWN DISPOSABLE) ×1 IMPLANT
GOWN STRL REUS W/TWL XL LVL3 (GOWN DISPOSABLE) ×2
HEMOSTAT ARISTA ABSORB 3G PWDR (HEMOSTASIS) ×2 IMPLANT
HOLDER FOLEY CATH W/STRAP (MISCELLANEOUS) ×2 IMPLANT
KIT TURNOVER CYSTO (KITS) ×3 IMPLANT
LEGGING LITHOTOMY PAIR STRL (DRAPES) ×3 IMPLANT
MANIPULATOR UTERINE 4.5 ZUMI (MISCELLANEOUS) ×3 IMPLANT
NDL INSUFFLATION 14GA 120MM (NEEDLE) ×1 IMPLANT
NEEDLE INSUFFLATION 14GA 120MM (NEEDLE) ×3 IMPLANT
NS IRRIG 1000ML POUR BTL (IV SOLUTION) ×1 IMPLANT
PACK LAVH (CUSTOM PROCEDURE TRAY) ×3 IMPLANT
PACK ROBOTIC GOWN (GOWN DISPOSABLE) ×3 IMPLANT
PACK TRENDGUARD 450 HYBRID PRO (MISCELLANEOUS) IMPLANT
PROTECTOR NERVE ULNAR (MISCELLANEOUS) ×6 IMPLANT
SCISSORS LAP 5X35 DISP (ENDOMECHANICALS) ×2 IMPLANT
SET IRRIG TUBING LAPAROSCOPIC (IRRIGATION / IRRIGATOR) ×4 IMPLANT
SET IRRIG Y TYPE TUR BLADDER L (SET/KITS/TRAYS/PACK) ×1 IMPLANT
SET SUCTION IRRIG HYDROSURG (IRRIGATION / IRRIGATOR) ×2 IMPLANT
SET TUBE SMOKE EVAC HIGH FLOW (TUBING) ×3 IMPLANT
SHEARS FOC LG CVD HARMONIC 17C (MISCELLANEOUS) IMPLANT
SHEARS HARMONIC ACE PLUS 36CM (ENDOMECHANICALS) ×3 IMPLANT
SLEEVE ENDOPATH XCEL 5M (ENDOMECHANICALS) ×6 IMPLANT
SOLUTION ELECTROLUBE (MISCELLANEOUS) ×2 IMPLANT
SUT VIC AB 0 CT1 18XCR BRD8 (SUTURE) ×2 IMPLANT
SUT VIC AB 0 CT1 36 (SUTURE) ×6 IMPLANT
SUT VIC AB 0 CT1 8-18 (SUTURE) ×4
SUT VIC AB 3-0 SH 18 (SUTURE) IMPLANT
SUT VIC AB 3-0 X1 27 (SUTURE) ×5 IMPLANT
SUT VICRYL 0 TIES 12 18 (SUTURE) ×3 IMPLANT
SUT VICRYL 0 UR6 27IN ABS (SUTURE) IMPLANT
SUT VICRYL 4-0 PS2 18IN ABS (SUTURE) ×3 IMPLANT
SYR 10ML LL (SYRINGE) ×3 IMPLANT
SYR 50ML LL SCALE MARK (SYRINGE) IMPLANT
SYR 5ML LL (SYRINGE) ×3 IMPLANT
TOWEL OR 17X26 10 PK STRL BLUE (TOWEL DISPOSABLE) ×4 IMPLANT
TRAY FOLEY W/BAG SLVR 14FR (SET/KITS/TRAYS/PACK) ×3 IMPLANT
TRENDGUARD 450 HYBRID PRO PACK (MISCELLANEOUS) ×3
TROCAR BLADELESS OPT 5 100 (ENDOMECHANICALS) ×2 IMPLANT
TROCAR OPTI TIP 5M 100M (ENDOMECHANICALS) ×3 IMPLANT
UNDERPAD 30X36 HEAVY ABSORB (UNDERPADS AND DIAPERS) ×3 IMPLANT
WARMER LAPAROSCOPE (MISCELLANEOUS) ×3 IMPLANT

## 2019-09-13 NOTE — Brief Op Note (Signed)
09/13/2019  9:40 AM  PATIENT:  Sarah Moreno  39 y.o. female  PRE-OPERATIVE DIAGNOSIS:  PELVIC PAIN  POST-OPERATIVE DIAGNOSIS:  PELVIC PAIN  PROCEDURE:  Procedure(s): LAPAROSCOPIC ASSISTED VAGINAL HYSTERECTOMY WITH SALPINGECTOMY (Bilateral)  SURGEON:  Surgeon(s) and Role:    * Willodean Rosenthal, MD - Primary    * Constant, Peggy, MD - Assisting  ANESTHESIA:   general  EBL:  100 mL   BLOOD ADMINISTERED:none  DRAINS: none   LOCAL MEDICATIONS USED:  MARCAINE     SPECIMEN:  Source of Specimen:  fallopain tubes, uterus and cervix  DISPOSITION OF SPECIMEN:  PATHOLOGY  COUNTS:  YES  TOURNIQUET:  * No tourniquets in log *  DICTATION: .Note written in EPIC  PLAN OF CARE: prolonged observation than discharge to home.   PATIENT DISPOSITION:  PACU - hemodynamically stable.   Delay start of Pharmacological VTE agent (>24hrs) due to surgical blood loss or risk of bleeding: yes  Complications: none immediate  Zuma Hust L. Harraway-Smith, M.D., Evern Core

## 2019-09-13 NOTE — Anesthesia Postprocedure Evaluation (Signed)
Anesthesia Post Note  Patient: Sarah Moreno  Procedure(s) Performed: LAPAROSCOPIC ASSISTED VAGINAL HYSTERECTOMY WITH SALPINGECTOMY (Bilateral Abdomen)     Patient location during evaluation: PACU Anesthesia Type: General Level of consciousness: awake and alert Pain management: pain level controlled Vital Signs Assessment: post-procedure vital signs reviewed and stable Respiratory status: spontaneous breathing, nonlabored ventilation and respiratory function stable Cardiovascular status: blood pressure returned to baseline and stable Postop Assessment: no apparent nausea or vomiting Anesthetic complications: no    Last Vitals:  Vitals:   09/13/19 1045 09/13/19 1104  BP: 123/63 135/65  Pulse: 96 (!) 103  Resp: (!) 9 14  Temp:  (!) 36.1 C  SpO2: 98% 97%    Last Pain:  Vitals:   09/13/19 1115  TempSrc:   PainSc: 5                  Lucretia Kern

## 2019-09-13 NOTE — Transfer of Care (Signed)
Immediate Anesthesia Transfer of Care Note  Patient: Sarah Moreno  Procedure(s) Performed: LAPAROSCOPIC ASSISTED VAGINAL HYSTERECTOMY WITH SALPINGECTOMY (Bilateral Abdomen)  Patient Location: PACU  Anesthesia Type:General  Level of Consciousness: sedated  Airway & Oxygen Therapy: Patient Spontanous Breathing and Patient connected to nasal cannula oxygen  Post-op Assessment: Report given to RN  Post vital signs: Reviewed and stable  Last Vitals:  Vitals Value Taken Time  BP 130/77 09/13/19 0946  Temp 36.8 C 09/13/19 0946  Pulse 99 09/13/19 0946  Resp 18 09/13/19 0946  SpO2 92 % 09/13/19 0946  Vitals shown include unvalidated device data.  Last Pain:  Vitals:   09/13/19 0621  TempSrc:   PainSc: 0-No pain      Patients Stated Pain Goal: 5 (09/13/19 3935)  Complications: No apparent anesthesia complications

## 2019-09-13 NOTE — H&P (Signed)
Preoperative History and Physical  Sarah Moreno is a 39 y.o. G3P2100 here for surgical management of chronic pelvic pain.   Proposed surgery: laparoscopic assisted total laparoscopic hysterectomy with bilateral salpingectomy  Past Medical History:  Diagnosis Date  . Allergic rhinitis   . Allergy   . Anemia   . Angio-edema   . Anxiety 11/2015  . Anxiety   . Arthritis   . Asthma   . 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 Bipolar 1 disorder ([place]) 2000  . Depression   . Dysphagia   . Gastro-esophageal reflux disease without esophagitis   . Heart murmur    as a baby no longer has  . Migraine   . Mood disorder ([place])   . Other abnormal glucose    gestational diabetes  . Psoriasis 2016  . PTSD (post-traumatic stress disorder) 11/2015  . PTSD (post-traumatic stress disorder)   . Seizures ([place])   . 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 Urticaria    Past Surgical History:  Procedure Laterality Date  . CERVICAL BIOPSY  W/ LOOP ELECTRODE EXCISION    . ESOPHAGEAL MANOMETRY N/A 05/19/2018   Procedure: ESOPHAGEAL MANOMETRY (EM);  Surgeon: Thornton Park, MD;  Location: [facility];  Service: Gastroenterology;  Laterality: N/A;  . HEMORROIDECTOMY  2009  . INGUINAL HERNIA REPAIR Left 2010  . [place] IMPEDANCE STUDY  05/19/2018   Procedure: [place] IMPEDANCE STUDY;  Surgeon: Thornton Park, MD;  Location: [facility];  Service: Gastroenterology;;  . TUBAL LIGATION  2009  . TUBAL LIGATION    . UPPER GASTROINTESTINAL ENDOSCOPY  04/26/2018   OB History    Gravida  3   Para  3   Term  2   Preterm  1   AB  0   Living        SAB  0   TAB  0   Ectopic  0   Multiple      Live Births             Patient denies any cervical dysplasia or STIs. Facility-Administered Medications Prior to Admission  Medication Dose Route Frequency Provider Last Rate Last Admin  . ipratropium-albuterol (DUONEB) 0.5-2.5 (3) MG/3ML nebulizer solution 3 mL  3 mL  Nebulization Q6H Padgett, Rae Halsted, MD       Medications Prior to Admission  Medication Sig Dispense Refill Last Dose  . albuterol (VENTOLIN HFA) 108 (90 Base) MCG/ACT inhaler Inhale 1-2 puffs into the lungs every 6 (six) hours as needed (wheezing/shortness of breath.).    09/13/2019 at Unknown time  . aspirin EC 81 MG tablet Take 81 mg by mouth every 4 (four) hours as needed (chest pains.).   Past Month at Unknown time  . budesonide-formoterol (SYMBICORT) 160-4.5 MCG/ACT inhaler Inhale 2 puffs into the lungs 2 (two) times daily as needed (respiratory issues.).   Past Week at Unknown time  . cariprazine (VRAYLAR) capsule Take 1.5 mg by mouth at bedtime.    Past Week at Unknown time  . FLUoxetine (PROZAC) 20 MG capsule Take 20 mg by mouth daily.   09/12/2019 at Unknown time  . ibuprofen (ADVIL) 800 MG tablet Take 1 tablet (800 mg total) by mouth every 8 (eight) hours as needed. (Patient taking differently: Take 800 mg by mouth every 8 (eight) hours as needed (pain.). ) 60 tablet 1 Past Month at Unknown time  . meloxicam (MOBIC) 7.5 MG tablet Take 7.5 mg by mouth as  needed for pain.   Past Week at Unknown time  . Multiple Vitamin (MULTIVITAMIN WITH MINERALS) TABS tablet Take 1 tablet by mouth 2 (two) times a week.   Past Month at Unknown time  . omeprazole (PRILOSEC) 40 MG capsule TAKE 1 CAPSULE BY MOUTH TWICE A DAY (Patient taking differently: Take 40 mg by mouth in the morning and at bedtime. ) 60 capsule 3 Past Week at Unknown time  . Polyethyl Glycol-Propyl Glycol (SYSTANE) 0.4-0.3 % SOLN Place 1 drop into both eyes 3 (three) times daily as needed (dry/irritated eyes.).   Past Week at Unknown time  . prazosin (MINIPRESS) 1 MG capsule Take 1 mg by mouth at bedtime.   Past Week at Unknown time  . Vibegron (GEMTESA) 75 MG TABS Take 1 tablet by mouth daily.   Past Week at Unknown time  . Olopatadine HCl (PATADAY) 0.2 % SOLN Place 1 drop into both eyes 2 (two) times daily as needed  (allergy/irritated eyes.).    More than a month at Unknown time  . sucralfate (CARAFATE) 1 g tablet Take 1 g by mouth 2 (two) times daily before a meal.   More than a month at Unknown time  . tolterodine (DETROL LA) 4 MG 24 hr capsule Take 4 mg by mouth daily.    More than a month at Unknown time  . umeclidinium-vilanterol (ANORO ELLIPTA) 62.5-25 MCG/INH AEPB Inhale 1 puff into the lungs daily.   More than a month at Unknown time    Allergies  Allergen Reactions  . Amitriptyline Swelling  . Orajel Mouth-Aid [Carbamide Peroxide] Anaphylaxis and Hives    Throat closes  . Benzocaine Hives  . Other Hives    Mouth numbing agents  . Penicillin G Hives  . Nsaids Rash   Social History:   reports that she has quit smoking. Her smoking use included cigarettes. She has a 1.00 pack-year smoking history. She has never used smokeless tobacco. She reports previous alcohol use. She reports previous drug use. Family History  Problem Relation Age of Onset  . COPD Father   . Drug abuse Father   . Endometrial cancer Maternal Aunt   . Breast cancer Maternal Aunt   . Colon cancer Neg Hx   . Esophageal cancer Neg Hx   . Rectal cancer Neg Hx   . Stomach cancer Neg Hx   . Colon polyps Neg Hx   . Depression Mother   . Depression Father   . Depression Sister   . Breast cancer Maternal Aunt   . Schizophrenia Paternal Aunt   . Depression Paternal Aunt   . Suicidality Paternal Uncle   . Depression Paternal Uncle   . Depression Sister   . Epilepsy Sister   . Breast cancer Maternal Aunt   . Breast cancer Maternal Aunt   . Depression Paternal Aunt   . Depression Paternal Aunt   . Depression Paternal Aunt   . Depression Paternal Aunt   . Depression Paternal Aunt   . Depression Paternal Uncle   . Depression Paternal Uncle     Review of Systems: Noncontributory  PHYSICAL EXAM: Blood pressure 131/74, pulse 79, temperature 98 F (36.7 C), temperature source Oral, resp. rate 18, height 5\' 3"  (1.6 m),  weight 90.5 kg, last menstrual period 08/19/2019, SpO2 100 %. General appearance - alert, well appearing, and in no distress Chest - clear to auscultation, no wheezes, rales or rhonchi, symmetric air entry Heart - normal rate and regular rhythm Abdomen - soft, nontender,  nondistended, no masses or organomegaly Pelvic - small mobile. No nodularity noted.  Extremities - peripheral pulses normal, no pedal edema, no clubbing or cyanosis  Labs: Results for orders placed or performed during the hospital encounter of 09/09/19 (from the past 336 hour(s))  SARS CORONAVIRUS 2 (TAT 6-24 HRS) Nasopharyngeal Nasopharyngeal Swab   Collection Time: 09/09/19  9:06 AM   Specimen: Nasopharyngeal Swab  Result Value Ref Range   SARS Coronavirus 2 NEGATIVE NEGATIVE  Results for orders placed or performed during the hospital encounter of 09/02/19 (from the past 336 hour(s))  CBC   Collection Time: 09/02/19  4:16 PM  Result Value Ref Range   WBC 11.0 (H) 4.0 - 10.5 K/uL   RBC 3.95 3.87 - 5.11 MIL/uL   Hemoglobin 10.8 (L) 12.0 - 15.0 g/dL   HCT 39.7 (L) 67.3 - 41.9 %   MCV 87.8 80.0 - 100.0 fL   MCH 27.3 26.0 - 34.0 pg   MCHC 31.1 30.0 - 36.0 g/dL   RDW 37.9 02.4 - 09.7 %   Platelets 350 150 - 400 K/uL   nRBC 0.0 0.0 - 0.2 %  Type and screen All cardiac and thoracic surgeries, spinal fusions, myomectomies, craniotomies, colon & liver resections, total joint revisions, same day c-section with placenta previa or accreta   Collection Time: 09/02/19  4:16 PM  Result Value Ref Range   ABO/RH(D) AB POS    Antibody Screen NEG    Sample Expiration 09/16/2019,2359    Extend sample reason      NO TRANSFUSIONS OR PREGNANCY IN THE PAST 3 MONTHS Performed at [facility], [address], [place], [place] [address]   ABO/Rh   Collection Time: 09/02/19  4:16 PM  Result Value Ref Range   ABO/RH(D)      AB POS Performed at [facility], [address], [place], [place]  [address]   Results for orders placed or performed during the hospital encounter of 08/30/19 (from the past 336 hour(s))  Basic metabolic panel   Collection Time: 08/30/19  1:19 PM  Result Value Ref Range   Sodium 139 135 - 145 mmol/L   Potassium 4.0 3.5 - 5.1 mmol/L   Chloride 101 98 - 111 mmol/L   CO2 25 22 - 32 mmol/L   Glucose, Bld 118 (H) 70 - 99 mg/dL   BUN 11 6 - 20 mg/dL   Creatinine, Ser 8.34 (H) 0.44 - 1.00 mg/dL   Calcium 9.5 8.9 - 19.6 mg/dL   GFR calc non Af Amer >60 >60 mL/min   GFR calc Af Amer >60 >60 mL/min   Anion gap 13 5 - 15  CBC   Collection Time: 08/30/19  1:19 PM  Result Value Ref Range   WBC 11.2 (H) 4.0 - 10.5 K/uL   RBC 4.35 3.87 - 5.11 MIL/uL   Hemoglobin 11.8 (L) 12.0 - 15.0 g/dL   HCT 22.2 97.9 - 89.2 %   MCV 87.4 80.0 - 100.0 fL   MCH 27.1 26.0 - 34.0 pg   MCHC 31.1 30.0 - 36.0 g/dL   RDW 11.9 41.7 - 40.8 %   Platelets 391 150 - 400 K/uL   nRBC 0.0 0.0 - 0.2 %  Troponin I (High Sensitivity)   Collection Time: 08/30/19  1:19 PM  Result Value Ref Range   Troponin I (High Sensitivity) 3 <18 ng/L  I-Stat beta hCG blood, ED   Collection Time: 08/30/19  1:37 PM  Result Value Ref Range  I-stat hCG, quantitative <5.0 <5 mIU/mL   Comment 3            Imaging Studies: DG Chest 2 View  Result Date: 08/30/2019 CLINICAL DATA:  Chest pain. EXAM: CHEST - 2 VIEW COMPARISON:  None. FINDINGS: The heart size and mediastinal contours are within normal limits. Both lungs are clear. No pneumothorax or pleural effusion is noted. The visualized skeletal structures are unremarkable. IMPRESSION: No active cardiopulmonary disease. Electronically Signed   By: Lupita Raider M.D.   On: 08/30/2019 13:32    Assessment: Patient Active Problem List   Diagnosis Date Noted  . Chronic pelvic pain in female 09/13/2019  . 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 Pelvic pain 05/09/2019  . Vaginal pain 05/09/2019  . Mixed stress and urge urinary incontinence 05/09/2019  . Depression  05/09/2019  . Chronic post-traumatic stress disorder (PTSD) 05/09/2019  . Gastroesophageal reflux disease   . Regurgitation of food   . Moderate persistent asthma 01/09/2016  . Allergic rhinoconjunctivitis 01/09/2016  . Urticaria, chronic 01/09/2016  . Angioedema 01/09/2016    Plan: Patient will undergo surgical management with  laparoscopic assisted total laparoscopic hysterectomy with bilateral salpingectomy.   The risks of surgery were discussed in detail with the patient including but not limited to: bleeding which may require transfusion or reoperation; infection which may require antibiotics; injury to surrounding organs which may involve bowel, bladder, ureters ; need for additional procedures including laparoscopy or laparotomy; thromboembolic phenomenon, surgical site problems and other postoperative/anesthesia complications. Likelihood of success in alleviating the patient's condition was discussed. Routine postoperative instructions will be reviewed with the patient and her family in detail after surgery.  The patient concurred with the proposed plan, giving informed written consent for the surgery.  Patient has been NPO since last night she will remain NPO for procedure.  Anesthesia and OR aware.  Preoperative prophylactic antibiotics and SCDs ordered on call to the OR.  To OR when ready.  Cincere Deprey L. Erin Fulling, M.D., [facility] 09/13/2019 7:15 AM

## 2019-09-13 NOTE — Anesthesia Procedure Notes (Signed)
Procedure Name: Intubation Date/Time: 09/13/2019 7:33 AM Performed by: Bonney Aid, CRNA Pre-anesthesia Checklist: Patient identified, Emergency Drugs available, Suction available and Patient being monitored Patient Re-evaluated:Patient Re-evaluated prior to induction Oxygen Delivery Method: Circle system utilized Preoxygenation: Pre-oxygenation with 100% oxygen Induction Type: IV induction Ventilation: Mask ventilation without difficulty Laryngoscope Size: Mac and 3 Grade View: Grade I Tube type: Oral Tube size: 7.0 mm Number of attempts: 1 Airway Equipment and Method: Stylet Placement Confirmation: ETT inserted through vocal cords under direct vision,  positive ETCO2 and breath sounds checked- equal and bilateral Secured at: 20 cm Tube secured with: Tape Dental Injury: Teeth and Oropharynx as per pre-operative assessment

## 2019-09-13 NOTE — Anesthesia Preprocedure Evaluation (Addendum)
Anesthesia Evaluation  Patient identified by MRN, date of birth, ID band Patient awake    Reviewed: Allergy & Precautions, NPO status , Patient's Chart, lab work & pertinent test results  History of Anesthesia Complications Negative for: history of anesthetic complications  Airway Mallampati: II  TM Distance: >3 FB Neck ROM: Full    Dental  (+) Edentulous Upper   Pulmonary asthma , former smoker,    Pulmonary exam normal        Cardiovascular negative cardio ROS Normal cardiovascular exam     Neuro/Psych  Headaches, Anxiety Depression Bipolar Disorder    GI/Hepatic Neg liver ROS, GERD  ,  Endo/Other  negative endocrine ROS  Renal/GU negative Renal ROS   Chronic pelvic pain    Musculoskeletal negative musculoskeletal ROS (+)   Abdominal   Peds  Hematology  (+) Blood dyscrasia, anemia ,   Anesthesia Other Findings  Went to ED recently for chest pain, but appears to have left prior to evaluation. EKG showed NSR with no concerning findings. Per patient, she was evaluated by her PCP who determined the pain was coming from her heart lining. She was started on medication and the pain has completely resolved. She can complete >4 METS without CP/DOE.  Reproductive/Obstetrics                          Anesthesia Physical Anesthesia Plan  ASA: II  Anesthesia Plan: General   Post-op Pain Management:    Induction: Intravenous  PONV Risk Score and Plan: 4 or greater and Ondansetron, Dexamethasone, Treatment may vary due to age or medical condition and Midazolam  Airway Management Planned: Oral ETT  Additional Equipment: None  Intra-op Plan:   Post-operative Plan: Extubation in OR  Informed Consent: I have reviewed the patients History and Physical, chart, labs and discussed the procedure including the risks, benefits and alternatives for the proposed anesthesia with the patient or authorized  representative who has indicated his/her understanding and acceptance.     Dental advisory given  Plan Discussed with:   Anesthesia Plan Comments:         Anesthesia Quick Evaluation

## 2019-09-13 NOTE — Op Note (Signed)
09/13/2019  9:40 AM  PATIENT:  Sarah Moreno  39 y.o. female  PRE-OPERATIVE DIAGNOSIS:  PELVIC PAIN  POST-OPERATIVE DIAGNOSIS:  PELVIC PAIN  PROCEDURE:  Procedure(s): LAPAROSCOPIC ASSISTED VAGINAL HYSTERECTOMY WITH SALPINGECTOMY (Bilateral)  SURGEON:  Surgeon(s) and Role:    * Lavonia Drafts, MD - Primary    * Constant, Peggy, MD - Assisting  ANESTHESIA:   General; local with marcaine  EBL:  100 mL   BLOOD ADMINISTERED:none  DRAINS: none   LOCAL MEDICATIONS USED:  MARCAINE     SPECIMEN:  Source of Specimen:  fallopain tubes, uterus and cervix  DISPOSITION OF SPECIMEN:  PATHOLOGY  COUNTS:  YES  TOURNIQUET:  * No tourniquets in log *  DICTATION: .Note written in EPIC  PLAN OF CARE: prolonged observation than discharge to home.   PATIENT DISPOSITION:  PACU - hemodynamically stable.   Delay start of Pharmacological VTE agent (>24hrs) due to surgical blood loss or risk of bleeding: yes  Complications: none immediate  INDICATIONS: 39 yo P2 with aforementioned preoprative diagnoses here today for definitive surgical management.   Risks of surgery were discussed with the patient including but not limited to: bleeding which may require transfusion or reoperation; infection which may require antibiotics; injury to bowel, bladder, ureters or other surrounding organs; need for additional procedures including laparotomy; thromboembolic phenomenon, incisional problems and other postoperative/anesthesia complications. Written informed consent was obtained.    FINDINGS: 8 week sized uterus, normal adnexa on right. Left ovarian cyst- simple in nature. Fallopian tubes normal bilaterally.  Normal upper abdomen.  PROCEDURE IN DETAIL:  The patient received intravenous antibiotics and had sequential compression devices applied to her lower extremities while in the preoperative area.  She was then taken to the operating room where general anesthesia was administered and was found to  be adequate.  She was placed in the dorsal lithotomy position, and was prepped and draped in a sterile manner.  A Foley catheter was inserted into her bladder and attached to constant drainage and a HUMI uterine manipulator was then advanced into the uterus .  After an adequate timeout was performed, attention was then turned to the patient's abdomen where a 5-mm skin incision was made in the left upper quadrant.  An Optiview trocar was then placed through the incision.  After intraperitoneal placement was confirmed a pneumoperitoneum was obtained with carbon dioxide gas.  A survey of the patient's pelvis and abdomen revealed the above anatomy.   Bilateral 5-mm lower quadrant ports  were then placed under direct visualization.  The pelvis was then carefully examined.   On the right side, the mesosalpinx was grasped and clamped and ligated using the Harmonic scalpel. The round ligament was then clamped and transected with the Harmonic scapel device.  The uteroovarian ligament was also clamped and transected.  Excellent hemostasis was noted.  The broad ligament was serially clamped using the Harmonic to the level of the uterine artieries. Excellent hemostasis was noted. The left side was completed in a similar manner. At this point, the trocars were left in place and we proceeded with completing the hysterectomy via the vaginal route .  Attention was then turned to her pelvis.  A weighted speculum was then placed in the vagina, and the anterior and posterior lips of the cervix were grasped bilaterally with Edison Nasuti tenaculums.  The cervix was then injected circumferentially with dilute vasopressin solution.  The cervix was then circumferentially incised, and the anterior cul-de-sac was entered sharply without difficulty and a retractor  was placed.  The same procedure was performed posteriorly and the posterior cul-de-sac was entered sharply without difficulty.  A long weighted speculum was inserted into the posterior  cul-de-sac.  The Heaney clamp was then used to clamp the uterosacral ligaments on either side.  They were then cut and sutured ligated with 0 Vicryl, and were held with a tag for later identification. Of note, all sutures used in this case were 0 Vicryl unless otherwise noted.   The cardinal ligaments were then clamped, cut and ligated bilaterally. The uterine vessels and broad ligaments were then serially clamped with the Heaney clamps, cut, and suture ligated on both sides.  The uterus was noted to be freed from all ligaments and was then delivered and sent to pathology.  Excellent hemostasis was noted at this point. The uterus was noted to be freed from all ligaments and was then delivered and sent to pathology.   After completion of the hysterectomy, all pedicles from the uterosacral ligament to the cornua were examined hemostasis was confirmed.  The uterosacral pedicles were then affixed to the ipsilateral vaginal cuff angles using 0 Vicryl sutures.  The vaginal cuff was then closed in a running locked fashion with 0 Vicryl.  All instruments were then removed from the pelvis.   Attention was then returned to her abdomen which was insufflated again with carbon dioxide gas.  The laparoscope was used to survey the operative site, and it was found to be hemostatic.   No intraoperative injury to other surrounding organs was noted. Arista was placed over the vaginal cuff and the abdomen was desufflated and all instruments were then removed from the patient's abdomen. The port sites were repaired with 3-0 vicryl and all of the sites were injected with 0.5% Marcaine a total of 40cc.  Dermabond was also applied. The patient tolerated the procedures well.  All instruments, needles, and sponge counts were correct x 2. The patient was taken to the recovery room awake, extubated and in stable condition.   An experienced assistant was required given the standard of surgical care given the complexity of the case.  This  assistant was needed for exposure, dissection, suctioning, retraction, instrument exchange and for overall help during the procedure.  Hailey Stormer L. Harraway-Smith, M.D., [facility]

## 2019-09-13 NOTE — Addendum Note (Signed)
Addendum  created 09/13/19 1247 by Lucretia Kern, MD   Clinical Note Signed

## 2019-09-13 NOTE — Discharge Instructions (Signed)
Laparoscopically Assisted Vaginal Hysterectomy, Care After This sheet gives you information about how to care for yourself after your procedure. Your health care provider may also give you more specific instructions. If you have problems or questions, contact your health care provider. What can I expect after the procedure? After the procedure, it is common to have:  Soreness and numbness in your incision areas.  Abdominal pain. You will be given pain medicine to control it.  Vaginal bleeding and discharge. You will need to use a sanitary napkin after this procedure.  Sore throat from the breathing tube that was inserted during surgery. Follow these instructions at home: Medicines  Take over-the-counter and prescription medicines only as told by your health care provider.  Do not take aspirin or ibuprofen. These medicines can cause bleeding.  Do not drive or use heavy machinery while taking prescription pain medicine.  Do not drive for 24 hours if you were given a medicine to help you relax (sedative) during the procedure. Incision care   Follow instructions from your health care provider about how to take care of your incisions. Make sure you: ? Wash your hands with soap and water before you change your bandage (dressing). If soap and water are not available, use hand sanitizer. ? Change your dressing as told by your health care provider. ? Leave stitches (sutures), skin glue, or adhesive strips in place. These skin closures may need to stay in place for 2 weeks or longer. If adhesive strip edges start to loosen and curl up, you may trim the loose edges. Do not remove adhesive strips completely unless your health care provider tells you to do that.  Check your incision area every day for signs of infection. Check for: ? Redness, swelling, or pain. ? Fluid or blood. ? Warmth. ? Pus or a bad smell. Activity  Get regular exercise as told by your health care provider. You may be  told to take short walks every day and go farther each time.  Return to your normal activities as told by your health care provider. Ask your health care provider what activities are safe for you.  Do not douche, use tampons, or have sexual intercourse for at least 6 weeks, or until your health care provider gives you permission.  Do not lift anything that is heavier than 10 lb (4.5 kg), or the limit that your health care provider tells you, until he or she says that it is safe. General instructions  Do not take baths, swim, or use a hot tub until your health care provider approves. Take showers instead of baths.  Do not drive for 24 hours if you received a sedative.  Do not drive or operate heavy machinery while taking prescription pain medicine.  To prevent or treat constipation while you are taking prescription pain medicine, your health care provider may recommend that you: ? Drink enough fluid to keep your urine clear or pale yellow. ? Take over-the-counter or prescription medicines. ? Eat foods that are high in fiber, such as fresh fruits and vegetables, whole grains, and beans. ? Limit foods that are high in fat and processed sugars, such as fried and sweet foods.  Keep all follow-up visits as told by your health care provider. This is important. Contact a health care provider if:  You have signs of infection, such as: ? Redness, swelling, or pain around your incision sites. ? Fluid or blood coming from an incision. ? An incision that feels warm to the   touch. ? Pus or a bad smell coming from an incision.  Your incision breaks open.  Your pain medicine is not helping.  You feel dizzy or light-headed.  You have pain or bleeding when you urinate.  You have persistent nausea and vomiting.  You have blood, pus, or a bad-smelling discharge from your vagina. Get help right away if:  You have a fever.  You have severe abdominal pain.  You have chest pain.  You have  shortness of breath.  You faint.  You have pain, swelling, or redness in your leg.  You have heavy bleeding from your vagina. Summary  After the procedure, it is common to have abdominal pain and vaginal bleeding.  You should not drive or lift heavy objects until your health care provider says that it is safe.  Contact your health care provider if you have any symptoms of infection, excessive vaginal bleeding, nausea, vomiting, or shortness of breath. This information is not intended to replace advice given to you by your health care provider. Make sure you discuss any questions you have with your health care provider. Document Revised: 03/13/2017 Document Reviewed: 05/27/2016 Elsevier Patient Education  2020 [employer]  

## 2019-09-14 LAB — SURGICAL PATHOLOGY

## 2019-09-14 NOTE — Discharge Summary (Signed)
Physician Discharge Summary  Patient ID: Sarah Moreno MRN: [id number] DOB/AGE: 1980-10-11 39 y.o.  Admit date: 09/13/2019 Discharge date: 09/14/2019  Admission Diagnoses: chronic pelvic pain  Discharge Diagnoses:  Active Problems:   HPV in female   Chronic pelvic pain in female   Post-operative state   Discharged Condition: good  Hospital Course: Patient had an uncomplicated surgery; for further details of this surgery, please refer to the operative note. Furthermore, the patient had an uncomplicated postoperative course.  By time of discharge, her pain was controlled on oral pain medications; she was ambulating, voiding without difficulty, tolerating regular diet and passing flatus.  She was deemed stable for discharge to home.    Treatments: surgery: LAVH with bilateral salpingectomy   Discharge Exam: Blood pressure 121/69, pulse (!) 110, temperature (!) 97.4 F (36.3 C), resp. rate 16, height 5\' 3"  (1.6 m), weight 90.5 kg, last menstrual period 08/19/2019, SpO2 98 %. General appearance: alert and no distress GI: soft, non-tender; bowel sounds normal; no masses,  no organomegaly Extremities: extremities normal, atraumatic, no cyanosis or edema  Disposition: Discharge disposition: 01-Home or Self Care       Discharge Instructions    Call MD for:  difficulty breathing, headache or visual disturbances   Complete by: As directed    Call MD for:  extreme fatigue   Complete by: As directed    Call MD for:  hives   Complete by: As directed    Call MD for:  persistant dizziness or light-headedness   Complete by: As directed    Call MD for:  persistant nausea and vomiting   Complete by: As directed    Call MD for:  redness, tenderness, or signs of infection (pain, swelling, redness, odor or green/yellow discharge around incision site)   Complete by: As directed    Call MD for:  severe uncontrolled pain   Complete by: As directed    Call MD for:  temperature >100.4   Complete  by: As directed    Diet - low sodium heart healthy   Complete by: As directed    Discharge wound care:   Complete by: As directed    Keep port sites clean and dry   Driving Restrictions   Complete by: As directed    No driving for 2 weeks   Increase activity slowly   Complete by: As directed    Lifting restrictions   Complete by: As directed    No heavy lifting for 6 weeks   Sexual Activity Restrictions   Complete by: As directed    No sexual intercourse for 6 week     Allergies as of 09/13/2019      Reactions   Amitriptyline Swelling   Orajel Mouth-aid [carbamide Peroxide] Anaphylaxis, Hives   Throat closes   Benzocaine Hives   Other Hives   Mouth numbing agents   Penicillin G Hives   Nsaids Rash      Medication List    STOP taking these medications   meloxicam 7.5 MG tablet Commonly known as: MOBIC     TAKE these medications   albuterol 108 (90 Base) MCG/ACT inhaler Commonly known as: VENTOLIN HFA Inhale 1-2 puffs into the lungs every 6 (six) hours as needed (wheezing/shortness of breath.).   Anoro Ellipta 62.5-25 MCG/INH Aepb Generic drug: umeclidinium-vilanterol Inhale 1 puff into the lungs daily.   aspirin EC 81 MG tablet Take 81 mg by mouth every 4 (four) hours as needed (chest pains.).   budesonide-formoterol  160-4.5 MCG/ACT inhaler Commonly known as: SYMBICORT Inhale 2 puffs into the lungs 2 (two) times daily as needed (respiratory issues.).   cariprazine capsule Commonly known as: VRAYLAR Take 1.5 mg by mouth at bedtime.   docusate sodium 100 MG capsule Commonly known as: COLACE Take 1 capsule (100 mg total) by mouth 2 (two) times daily.   FLUoxetine 20 MG capsule Commonly known as: PROZAC Take 20 mg by mouth daily.   Gemtesa 75 MG Tabs Generic drug: Vibegron Take 1 tablet by mouth daily.   ibuprofen 800 MG tablet Commonly known as: ADVIL Take 1 tablet (800 mg total) by mouth every 6 (six) hours. What changed:   when to take  this  reasons to take this   multivitamin with minerals Tabs tablet Take 1 tablet by mouth 2 (two) times a week.   omeprazole 40 MG capsule Commonly known as: PRILOSEC TAKE 1 CAPSULE BY MOUTH TWICE A DAY What changed: when to take this   oxyCODONE-acetaminophen 5-325 MG tablet Commonly known as: PERCOCET/ROXICET Take 1 tablet by mouth every 4 (four) hours as needed for moderate pain or severe pain.   Pataday 0.2 % Soln Generic drug: Olopatadine HCl Place 1 drop into both eyes 2 (two) times daily as needed (allergy/irritated eyes.).   prazosin 1 MG capsule Commonly known as: MINIPRESS Take 1 mg by mouth at bedtime.   sucralfate 1 g tablet Commonly known as: CARAFATE Take 1 g by mouth 2 (two) times daily before a meal.   Systane 0.4-0.3 % Soln Generic drug: Polyethyl Glycol-Propyl Glycol Place 1 drop into both eyes 3 (three) times daily as needed (dry/irritated eyes.).   tolterodine 4 MG 24 hr capsule Commonly known as: DETROL LA Take 4 mg by mouth daily.            Discharge Care Instructions  (From admission, onward)         Start     Ordered   09/13/19 0000  Discharge wound care:    Comments: Keep port sites clean and dry   09/13/19 1650         Follow-up Information    [facility] In 2 weeks.   Specialty: Internal Medicine Contact information: [address], [address] [place] [address] [phone]          Signed: Willodean Rosenthal 09/14/2019, 5:02 PM

## 2019-09-16 ENCOUNTER — Telehealth: Payer: Self-pay

## 2019-09-16 NOTE — Telephone Encounter (Addendum)
Spoke with pt to check in after surgery. Pt states she is feeling well and walking around often. Pt states she feels some pain in vagina. Pt is aware to call us if pain worsens or doesn't improve. Attempted to schedule pt's two week follow up appt. Pt wants to call back on Monday to schedule this appt.   ----- Message from Willodean Rosenthal, MD sent at 09/16/2019  9:52 AM EDT ----- Please call to see how pt is feeling post op. Make sure she is walking around often.   Thanks,  Clh-S

## 2019-09-19 ENCOUNTER — Encounter: Payer: Self-pay | Admitting: Obstetrics & Gynecology

## 2019-09-28 ENCOUNTER — Ambulatory Visit (INDEPENDENT_AMBULATORY_CARE_PROVIDER_SITE_OTHER): Payer: Medicare HMO | Admitting: Obstetrics & Gynecology

## 2019-09-28 ENCOUNTER — Other Ambulatory Visit: Payer: Self-pay

## 2019-09-28 ENCOUNTER — Encounter: Payer: Self-pay | Admitting: Obstetrics & Gynecology

## 2019-09-28 VITALS — BP 98/62 | HR 98 | Temp 97.7°F | Wt 199.0 lb

## 2019-09-28 DIAGNOSIS — Z9889 Other specified postprocedural states: Secondary | ICD-10-CM

## 2019-09-28 NOTE — Patient Instructions (Signed)
Laparoscopically Assisted Vaginal Hysterectomy, Care After This sheet gives you information about how to care for yourself after your procedure. Your health care provider may also give you more specific instructions. If you have problems or questions, contact your health care provider. What can I expect after the procedure? After the procedure, it is common to have:  Soreness and numbness in your incision areas.  Abdominal pain. You will be given pain medicine to control it.  Vaginal bleeding and discharge. You will need to use a sanitary napkin after this procedure.  Sore throat from the breathing tube that was inserted during surgery. Follow these instructions at home: Medicines  Take over-the-counter and prescription medicines only as told by your health care provider.  Do not take aspirin or ibuprofen. These medicines can cause bleeding.  Do not drive or use heavy machinery while taking prescription pain medicine.  Do not drive for 24 hours if you were given a medicine to help you relax (sedative) during the procedure. Incision care   Follow instructions from your health care provider about how to take care of your incisions. Make sure you: ? Wash your hands with soap and water before you change your bandage (dressing). If soap and water are not available, use hand sanitizer. ? Change your dressing as told by your health care provider. ? Leave stitches (sutures), skin glue, or adhesive strips in place. These skin closures may need to stay in place for 2 weeks or longer. If adhesive strip edges start to loosen and curl up, you may trim the loose edges. Do not remove adhesive strips completely unless your health care provider tells you to do that.  Check your incision area every day for signs of infection. Check for: ? Redness, swelling, or pain. ? Fluid or blood. ? Warmth. ? Pus or a bad smell. Activity  Get regular exercise as told by your health care provider. You may be  told to take short walks every day and go farther each time.  Return to your normal activities as told by your health care provider. Ask your health care provider what activities are safe for you.  Do not douche, use tampons, or have sexual intercourse for at least 6 weeks, or until your health care provider gives you permission.  Do not lift anything that is heavier than 10 lb (4.5 kg), or the limit that your health care provider tells you, until he or she says that it is safe. General instructions  Do not take baths, swim, or use a hot tub until your health care provider approves. Take showers instead of baths.  Do not drive for 24 hours if you received a sedative.  Do not drive or operate heavy machinery while taking prescription pain medicine.  To prevent or treat constipation while you are taking prescription pain medicine, your health care provider may recommend that you: ? Drink enough fluid to keep your urine clear or pale yellow. ? Take over-the-counter or prescription medicines. ? Eat foods that are high in fiber, such as fresh fruits and vegetables, whole grains, and beans. ? Limit foods that are high in fat and processed sugars, such as fried and sweet foods.  Keep all follow-up visits as told by your health care provider. This is important. Contact a health care provider if:  You have signs of infection, such as: ? Redness, swelling, or pain around your incision sites. ? Fluid or blood coming from an incision. ? An incision that feels warm to the   touch. ? Pus or a bad smell coming from an incision.  Your incision breaks open.  Your pain medicine is not helping.  You feel dizzy or light-headed.  You have pain or bleeding when you urinate.  You have persistent nausea and vomiting.  You have blood, pus, or a bad-smelling discharge from your vagina. Get help right away if:  You have a fever.  You have severe abdominal pain.  You have chest pain.  You have  shortness of breath.  You faint.  You have pain, swelling, or redness in your leg.  You have heavy bleeding from your vagina. Summary  After the procedure, it is common to have abdominal pain and vaginal bleeding.  You should not drive or lift heavy objects until your health care provider says that it is safe.  Contact your health care provider if you have any symptoms of infection, excessive vaginal bleeding, nausea, vomiting, or shortness of breath. This information is not intended to replace advice given to you by your health care provider. Make sure you discuss any questions you have with your health care provider. Document Revised: 03/13/2017 Document Reviewed: 05/27/2016 Elsevier Patient Education  2020 [employer] Inc.  

## 2019-09-28 NOTE — Progress Notes (Signed)
History:  39 y.o.LMP here today for 2 week post op check.Pt is s/p LAVH with bilateral salpingectomy on 09/13/2019.  Pt reports that she is doing well. She is eating and passing stols without difficulty.   She has more pain in the right lower quad port site than the other locations. Took motrin last 4 days prev.   The following portions of the patient's history were reviewed and updated as appropriate: allergies, current medications, past family history, past medical history, past social history, past surgical history and problem list.  Review of Systems:  Pertinent items are noted in HPI.    Objective:  Physical Exam BP 98/62   Pulse 98   Temp 97.7 F (36.5 C) (Oral)   Wt 199 lb (90.3 kg)   BMI 35.25 kg/m   CONSTITUTIONAL: Well-developed, well-nourished female in no acute distress.  HENT:  Normocephalic, atraumatic EYES: Conjunctivae and EOM are normal. No scleral icterus.  NECK: Normal range of motion SKIN: Skin is warm and dry. No rash noted. Not diaphoretic.No pallor. NEUROLGIC: Alert and oriented to person, place, and time. Normal coordination.  Abd: Soft, nontender and nondistended; her port sites are healing well. .  Pelvic: deferred  Labs and Imaging Surg path 09/13/2019 FINAL MICROSCOPIC DIAGNOSIS:   A. UTERUS AND BILATERAL TUBES, HYSTERECTOMY WITH BILATERAL  SALPINGECTOMY:  Uterus:  - Benign inactive endometrium  - No hyperplasia or malignancy identified   Cervix:  - Benign cervix  - No dysplasia or malignancy identified   Bilateral Fallopian tubes:  - Benign fallopian tubes with evidence of prior tubal ligation and  paratubal cyst(s)  - No malignancy identified   Assessment & Plan:  2 week post op check following LAVH with bilateral salpingectomy.   Doing well  Reviewed her surg path.   Reviewed post op instructions and activities  Gradual increase in activities  F/u in 4 weeks or sooner prn  Reviewed no intercourse for 8 weeks post op  All  questions answered.   Tiani Stanbery L. Harraway-Smith, M.D., [facility]

## 2019-10-05 ENCOUNTER — Other Ambulatory Visit: Payer: Self-pay

## 2019-10-05 DIAGNOSIS — J45909 Unspecified asthma, uncomplicated: Secondary | ICD-10-CM | POA: Diagnosis not present

## 2019-10-05 DIAGNOSIS — Z7982 Long term (current) use of aspirin: Secondary | ICD-10-CM | POA: Diagnosis not present

## 2019-10-05 DIAGNOSIS — L03818 Cellulitis of other sites: Secondary | ICD-10-CM | POA: Insufficient documentation

## 2019-10-05 NOTE — ED Triage Notes (Signed)
Pt had a hysterectomy three weeks ago and today the right side of her vagina is swollen and sore

## 2019-10-06 ENCOUNTER — Emergency Department (HOSPITAL_COMMUNITY): Payer: Medicare HMO

## 2019-10-06 ENCOUNTER — Other Ambulatory Visit: Payer: Self-pay

## 2019-10-06 ENCOUNTER — Telehealth: Payer: Self-pay

## 2019-10-06 ENCOUNTER — Emergency Department (HOSPITAL_COMMUNITY)
Admission: EM | Admit: 2019-10-06 | Discharge: 2019-10-06 | Disposition: A | Discharge status: Home / Self Care | Payer: Medicare HMO | Attending: Emergency Medicine | Admitting: Emergency Medicine

## 2019-10-06 ENCOUNTER — Encounter (HOSPITAL_COMMUNITY): Payer: Self-pay

## 2019-10-06 DIAGNOSIS — L03818 Cellulitis of other sites: Secondary | ICD-10-CM

## 2019-10-06 LAB — URINALYSIS, ROUTINE W REFLEX MICROSCOPIC
Bacteria, UA: NONE SEEN
Bilirubin Urine: NEGATIVE
Glucose, UA: NEGATIVE mg/dL
Hgb urine dipstick: NEGATIVE
Ketones, ur: NEGATIVE mg/dL
Nitrite: NEGATIVE
Protein, ur: NEGATIVE mg/dL
Specific Gravity, Urine: 1.023 (ref 1.005–1.030)
pH: 5 (ref 5.0–8.0)

## 2019-10-06 LAB — GC/CHLAMYDIA PROBE AMP (~~LOC~~) NOT AT ARMC
Chlamydia: NEGATIVE
Comment: NEGATIVE
Comment: NORMAL
Neisseria Gonorrhea: NEGATIVE

## 2019-10-06 LAB — WET PREP, GENITAL
Clue Cells Wet Prep HPF POC: NONE SEEN
Sperm: NONE SEEN
Trich, Wet Prep: NONE SEEN
Yeast Wet Prep HPF POC: NONE SEEN

## 2019-10-06 LAB — CBC WITH DIFFERENTIAL/PLATELET
Abs Immature Granulocytes: 0.02 10*3/uL (ref 0.00–0.07)
Basophils Absolute: 0 10*3/uL (ref 0.0–0.1)
Basophils Relative: 0 %
Eosinophils Absolute: 0.2 10*3/uL (ref 0.0–0.5)
Eosinophils Relative: 2 %
HCT: 35.1 % — ABNORMAL LOW (ref 36.0–46.0)
Hemoglobin: 10.7 g/dL — ABNORMAL LOW (ref 12.0–15.0)
Immature Granulocytes: 0 %
Lymphocytes Relative: 43 %
Lymphs Abs: 4.5 10*3/uL — ABNORMAL HIGH (ref 0.7–4.0)
MCH: 26.5 pg (ref 26.0–34.0)
MCHC: 30.5 g/dL (ref 30.0–36.0)
MCV: 86.9 fL (ref 80.0–100.0)
Monocytes Absolute: 0.8 10*3/uL (ref 0.1–1.0)
Monocytes Relative: 8 %
Neutro Abs: 4.9 10*3/uL (ref 1.7–7.7)
Neutrophils Relative %: 47 %
Platelets: 407 10*3/uL — ABNORMAL HIGH (ref 150–400)
RBC: 4.04 MIL/uL (ref 3.87–5.11)
RDW: 15.2 % (ref 11.5–15.5)
WBC: 10.4 10*3/uL (ref 4.0–10.5)
nRBC: 0 % (ref 0.0–0.2)

## 2019-10-06 LAB — COMPREHENSIVE METABOLIC PANEL
ALT: 16 U/L (ref 0–44)
AST: 29 U/L (ref 15–41)
Albumin: 3.7 g/dL (ref 3.5–5.0)
Alkaline Phosphatase: 95 U/L (ref 38–126)
Anion gap: 10 (ref 5–15)
BUN: 11 mg/dL (ref 6–20)
CO2: 25 mmol/L (ref 22–32)
Calcium: 9.1 mg/dL (ref 8.9–10.3)
Chloride: 104 mmol/L (ref 98–111)
Creatinine, Ser: 0.94 mg/dL (ref 0.44–1.00)
GFR calc Af Amer: >60 mL/min (ref >60)
GFR calc non Af Amer: >60 mL/min (ref >60)
Glucose, Bld: 99 mg/dL (ref 70–99)
Potassium: 5 mmol/L (ref 3.5–5.1)
Sodium: 139 mmol/L (ref 135–145)
Total Bilirubin: 0.6 mg/dL (ref 0.3–1.2)
Total Protein: 7.4 g/dL (ref 6.5–8.1)

## 2019-10-06 MED ORDER — SULFAMETHOXAZOLE-TRIMETHOPRIM 800-160 MG PO TABS
1.0000 | ORAL_TABLET | Freq: Once | ORAL | Status: AC
  Administered 2019-10-06: 1 via ORAL
  Filled 2019-10-06: qty 1

## 2019-10-06 MED ORDER — FENTANYL CITRATE (PF) 100 MCG/2ML IJ SOLN
50.0000 ug | Freq: Once | INTRAMUSCULAR | Status: AC
  Administered 2019-10-06: 50 ug via INTRAVENOUS
  Filled 2019-10-06: qty 2

## 2019-10-06 MED ORDER — SULFAMETHOXAZOLE-TRIMETHOPRIM 800-160 MG PO TABS
1.0000 | ORAL_TABLET | Freq: Two times a day (BID) | ORAL | 0 refills | Status: AC
Start: 2019-10-06 — End: 2019-10-16

## 2019-10-06 NOTE — Telephone Encounter (Signed)
Patient called and states that she is feeling a little better. Patient states that she was given antibiotics and feels she just needs to rest at this time. Patient declines coming in today for evaluation and plans to keep her scheduled post op. Patient instructed to call back if she doesn't see improvement in the vaginal swelling after a day or two of the antibiotics. Armandina Stammer RN

## 2019-10-06 NOTE — Telephone Encounter (Signed)
-----   Message from Willodean Rosenthal, MD sent at 10/06/2019 10:21 AM EDT ----- Please call pt. I do not see a docs note from the ED. Ask her if she feel that she needs to be seen again. If she please have her come in today. Early.   Thx,  Clh-S

## 2019-10-06 NOTE — ED Provider Notes (Signed)
[place] DEPT Provider Note   CSN: [id number] Arrival date & time: 10/05/19  2210     History No chief complaint on file.   Sarah Moreno is a 39 y.o. female.   Vaginal Discharge Quality:  White and yellow Severity:  Mild Onset quality:  Gradual Duration:  2 days Timing:  Constant Progression:  Worsening Chronicity:  New Context: not after intercourse and not at rest   Relieved by:  None tried Worsened by:  Nothing Ineffective treatments:  None tried Associated symptoms: genital lesions, rash and vaginal itching   Associated symptoms: no abdominal pain        Past Medical History:  Diagnosis Date  . Allergic rhinitis   . Allergy   . Anemia   . Angio-edema   . Anxiety 11/2015  . Anxiety   . Arthritis   . Asthma   . 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 Bipolar 1 disorder ([place]) 2000  . Depression   . Dysphagia   . Gastro-esophageal reflux disease without esophagitis   . Heart murmur    as a baby no longer has  . Migraine   . Mood disorder ([place])   . Other abnormal glucose    gestational diabetes  . Psoriasis 2016  . PTSD (post-traumatic stress disorder) 11/2015  . PTSD (post-traumatic stress disorder)   . Seizures ([place])   . 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 Urticaria     Patient Active Problem List   Diagnosis Date Noted  . Chronic pelvic pain in female 09/13/2019  . Post-operative state 09/13/2019  . High risk human papilloma virus (HPV) infection of cervix 05/13/2019  . Pelvic pain 05/09/2019  . Vaginal pain 05/09/2019  . Mixed stress and urge urinary incontinence 05/09/2019  . Depression 05/09/2019  . Chronic post-traumatic stress disorder (PTSD) 05/09/2019  . Gastroesophageal reflux disease   . Regurgitation of food   . Moderate persistent asthma 01/09/2016  . Allergic rhinoconjunctivitis 01/09/2016  . Urticaria, chronic 01/09/2016  . Angioedema 01/09/2016    Past  Surgical History:  Procedure Laterality Date  . CERVICAL BIOPSY  W/ LOOP ELECTRODE EXCISION    . ESOPHAGEAL MANOMETRY N/A 05/19/2018   Procedure: ESOPHAGEAL MANOMETRY (EM);  Surgeon: Thornton Park, MD;  Location: [facility];  Service: Gastroenterology;  Laterality: N/A;  . HEMORROIDECTOMY  2009  . INGUINAL HERNIA REPAIR Left 2010  . LAPAROSCOPIC VAGINAL HYSTERECTOMY WITH SALPINGECTOMY Bilateral 09/13/2019   Procedure: LAPAROSCOPIC ASSISTED VAGINAL HYSTERECTOMY WITH SALPINGECTOMY;  Surgeon: Lavonia Drafts, MD;  Location: [place] [place];  Service: Gynecology;  Laterality: Bilateral;  . [place] IMPEDANCE STUDY  05/19/2018   Procedure: [place] IMPEDANCE STUDY;  Surgeon: Thornton Park, MD;  Location: [facility];  Service: Gastroenterology;;  . TUBAL LIGATION  2009  . TUBAL LIGATION    . UPPER GASTROINTESTINAL ENDOSCOPY  04/26/2018     OB History    Gravida  3   Para  3   Term  2   Preterm  1   AB  0   Living        SAB  0   TAB  0   Ectopic  0   Multiple      Live Births              Family History  Problem Relation Age of Onset  . COPD Father   . Drug abuse Father   . Endometrial cancer Maternal Aunt   .  Breast cancer Maternal Aunt   . Colon cancer Neg Hx   . Esophageal cancer Neg Hx   . Rectal cancer Neg Hx   . Stomach cancer Neg Hx   . Colon polyps Neg Hx   . Depression Mother   . Depression Father   . Depression Sister   . Breast cancer Maternal Aunt   . Schizophrenia Paternal Aunt   . Depression Paternal Aunt   . Suicidality Paternal Uncle   . Depression Paternal Uncle   . Depression Sister   . Epilepsy Sister   . Breast cancer Maternal Aunt   . Breast cancer Maternal Aunt   . Depression Paternal Aunt   . Depression Paternal Aunt   . Depression Paternal Aunt   . Depression Paternal Aunt   . Depression Paternal Aunt   . Depression Paternal Uncle   . Depression Paternal Uncle     Social History   Tobacco Use  . Smoking  status: Former Smoker    Packs/day: 0.25    Years: 4.00    Pack years: 1.00    Types: Cigarettes  . Smokeless tobacco: Never Used  . Tobacco comment: Smokes CBD when anxious no longer  quit when 20   Vaping Use  . Vaping Use: Never used  Substance Use Topics  . Alcohol use: Not Currently  . Drug use: Not Currently    Home Medications Prior to Admission medications   Medication Sig Start Date End Date Taking? Authorizing Provider  albuterol (VENTOLIN HFA) 108 (90 Base) MCG/ACT inhaler Inhale 1-2 puffs into the lungs every 6 (six) hours as needed (wheezing/shortness of breath.).  09/13/18  Yes [provider]  aspirin EC 81 MG tablet Take 81 mg by mouth every 4 (four) hours as needed (chest pains.).   Yes [provider]  budesonide-formoterol (SYMBICORT) 160-4.5 MCG/ACT inhaler Inhale 2 puffs into the lungs 2 (two) times daily as needed (respiratory issues.).   Yes [provider]  cariprazine (VRAYLAR) capsule Take 1.5 mg by mouth at bedtime.    Yes [provider]  FLUoxetine (PROZAC) 20 MG capsule Take 20 mg by mouth daily. 07/25/19  Yes [provider]  ibuprofen (ADVIL) 800 MG tablet Take 1 tablet (800 mg total) by mouth every 6 (six) hours. 09/14/19  Yes Harraway-Smith, Eber Jones, MD  levocetirizine (XYZAL) 5 MG tablet Take 5 mg by mouth every evening.   Yes [provider]  Multiple Vitamin (MULTIVITAMIN WITH MINERALS) TABS tablet Take 1 tablet by mouth 2 (two) times a week.   Yes [provider]  Olopatadine HCl (PATADAY) 0.2 % SOLN Place 1 drop into both eyes 2 (two) times daily as needed (allergy/irritated eyes.).    Yes [provider]  omeprazole (PRILOSEC) 40 MG capsule TAKE 1 CAPSULE BY MOUTH TWICE A DAY Patient taking differently: Take 40 mg by mouth in the morning and at bedtime.  08/16/18  Yes Tressia Danas, MD  oxyCODONE-acetaminophen (PERCOCET/ROXICET) 5-325 MG tablet Take 1 tablet by mouth every 4 (four)  hours as needed for moderate pain or severe pain. 09/13/19  Yes Harraway-Smith, Eber Jones, MD  Polyethyl Glycol-Propyl Glycol (SYSTANE) 0.4-0.3 % SOLN Place 1 drop into both eyes 3 (three) times daily as needed (dry/irritated eyes.).   Yes [provider]  prazosin (MINIPRESS) 1 MG capsule Take 1 mg by mouth at bedtime. 07/24/19  Yes [provider]  sucralfate (CARAFATE) 1 g tablet Take 1 g by mouth 2 (two) times daily before a meal.  Yes [provider]  tolterodine (DETROL LA) 4 MG 24 hr capsule Take 4 mg by mouth daily.  04/10/19  Yes [provider]  sulfamethoxazole-trimethoprim (BACTRIM DS) 800-160 MG tablet Take 1 tablet by mouth 2 (two) times daily for 10 days. 10/06/19 10/16/19  Alyss Granato, Barbara Cower, MD    Allergies    Amitriptyline, Orajel mouth-aid [carbamide peroxide], Benzocaine, Other, Penicillin g, Percocet [oxycodone-acetaminophen], and Nsaids  Review of Systems   Review of Systems  Gastrointestinal: Negative for abdominal pain.  Genitourinary: Positive for vaginal discharge.  All other systems reviewed and are negative.   Physical Exam Updated Vital Signs BP 128/82 (BP Location: Right Arm)   Pulse 91   Temp 98.3 F (36.8 C) (Oral)   Resp 16   Ht 5\' 6"  (1.676 m)   Wt 90.3 kg   LMP 08/19/2019 (Approximate)   SpO2 96%   BMI 32.12 kg/m   Physical Exam Vitals and nursing note reviewed.  Constitutional:      Appearance: She is well-developed.  HENT:     Head: Normocephalic and atraumatic.     Mouth/Throat:     Mouth: Mucous membranes are moist.     Pharynx: Oropharynx is clear.  Eyes:     Pupils: Pupils are equal, round, and reactive to light.  Cardiovascular:     Rate and Rhythm: Normal rate and regular rhythm.     Heart sounds: No murmur heard.  No gallop.   Pulmonary:     Effort: No respiratory distress.     Breath sounds: No stridor.  Abdominal:     General: There is no distension.  Genitourinary:    Comments: Pelvic exam was  chaperoned by nurse.  Had some purulent discharge.  No obvious source.  Recent hysterectomy.  Also found to have erythema, warmth and tenderness over her mons pubis down to her labial area with multiple areas of folliculitis. Musculoskeletal:        General: No swelling or tenderness. Normal range of motion.     Cervical back: Normal range of motion.  Skin:    General: Skin is warm and dry.  Neurological:     General: No focal deficit present.     Mental Status: She is alert.     ED Results / Procedures / Treatments   Labs (all labs ordered are listed, but only abnormal results are displayed) Labs Reviewed  WET PREP, GENITAL - Abnormal; Notable for the following components:      Result Value   WBC, Wet Prep HPF POC MANY (*)    All other components within normal limits  CBC WITH DIFFERENTIAL/PLATELET - Abnormal; Notable for the following components:   Hemoglobin 10.7 (*)    HCT 35.1 (*)    Platelets 407 (*)    Lymphs Abs 4.5 (*)    All other components within normal limits  URINALYSIS, ROUTINE W REFLEX MICROSCOPIC - Abnormal; Notable for the following components:   Leukocytes,Ua TRACE (*)    All other components within normal limits  COMPREHENSIVE METABOLIC PANEL  GC/CHLAMYDIA PROBE AMP ([place]) NOT AT [facility]  WET PREP  (BD AFFIRM) ([place])    EKG None  Radiology 10/19/2019 Pelvis Complete  Result Date: 10/06/2019 CLINICAL DATA:  Assess for postoperative complication, hysterectomy and salpingectomy performed 09/13/2019 EXAM: TRANSABDOMINAL AND TRANSVAGINAL ULTRASOUND OF PELVIS TECHNIQUE: Both transabdominal and transvaginal ultrasound examinations of the pelvis were performed. Transabdominal technique was performed for global imaging of the pelvis including uterus, ovaries, adnexal regions, and pelvic  cul-de-sac. It was necessary to proceed with endovaginal exam following the transabdominal exam to visualize the ovaries and adnexa. COMPARISON:  Pelvic ultrasound 05/10/2019  FINDINGS: Uterus Surgically absent. Endometrium Surgically absent Right ovary Not visualized. Left ovary Not visualized. Other findings Small volume of free fluid is seen in the pelvis. No discernible organized collection or abscess is seen along vaginal cuff. IMPRESSION: Small volume of fluid in the pelvis is nonspecific. No discernible organized collection along the vaginal cuff or within the colon sac. If there is persisting clinical concern, advanced cross-sectional imaging could be obtained. Nonvisualization of the ovaries. Electronically Signed   By: Kreg Shropshire M.D.   On: 10/06/2019 03:23    Procedures Procedures (including critical care time)  Medications Ordered in ED Medications  fentaNYL (SUBLIMAZE) injection 50 mcg (50 mcg Intravenous Given 10/06/19 0300)  sulfamethoxazole-trimethoprim (BACTRIM DS) 800-160 MG per tablet 1 tablet (1 tablet Oral Given 10/06/19 0529)    ED Course  I have reviewed the triage vital signs and the nursing notes.  Pertinent labs & imaging results that were available during my care of the patient were reviewed by me and considered in my medical decision making (see chart for details).    MDM Rules/Calculators/A&P                          We will treat for cellulitis.  No obvious BV, yeast infection or STDs especially with recent hysterectomy.  Ultrasound done with no obvious abscesses.  Will follow up with gynecologist.  Final Clinical Impression(s) / ED Diagnoses Final diagnoses:  Cellulitis of other specified site    Rx / DC Orders ED Discharge Orders         Ordered    sulfamethoxazole-trimethoprim (BACTRIM DS) 800-160 MG tablet  2 times daily     Discontinue  Reprint     10/06/19 0504           Pearlene Teat, Barbara Cower, MD 10/06/19 2309

## 2019-10-09 ENCOUNTER — Encounter (HOSPITAL_COMMUNITY): Payer: Self-pay | Admitting: Emergency Medicine

## 2019-10-09 ENCOUNTER — Emergency Department (HOSPITAL_COMMUNITY)
Admission: EM | Admit: 2019-10-09 | Discharge: 2019-10-10 | Disposition: A | Discharge status: Home / Self Care | Payer: Medicare HMO | Attending: Emergency Medicine | Admitting: Emergency Medicine

## 2019-10-09 ENCOUNTER — Other Ambulatory Visit: Payer: Self-pay

## 2019-10-09 DIAGNOSIS — J45909 Unspecified asthma, uncomplicated: Secondary | ICD-10-CM | POA: Diagnosis not present

## 2019-10-09 DIAGNOSIS — K649 Unspecified hemorrhoids: Secondary | ICD-10-CM | POA: Diagnosis not present

## 2019-10-09 DIAGNOSIS — K644 Residual hemorrhoidal skin tags: Secondary | ICD-10-CM

## 2019-10-09 DIAGNOSIS — Z87891 Personal history of nicotine dependence: Secondary | ICD-10-CM | POA: Insufficient documentation

## 2019-10-09 DIAGNOSIS — Z7982 Long term (current) use of aspirin: Secondary | ICD-10-CM | POA: Insufficient documentation

## 2019-10-09 DIAGNOSIS — K5909 Other constipation: Secondary | ICD-10-CM | POA: Diagnosis not present

## 2019-10-09 DIAGNOSIS — Z880 Allergy status to penicillin: Secondary | ICD-10-CM | POA: Diagnosis not present

## 2019-10-09 DIAGNOSIS — K6289 Other specified diseases of anus and rectum: Secondary | ICD-10-CM | POA: Diagnosis not present

## 2019-10-09 DIAGNOSIS — K5903 Drug induced constipation: Secondary | ICD-10-CM

## 2019-10-09 NOTE — ED Triage Notes (Addendum)
Per EMS, patient from home, c/o constipation x2 days. Reports "bulge" coming out of rectum since 1800 today. C/o rectal pain since that time. Hsyterectomy 6/1.

## 2019-10-10 DIAGNOSIS — K6289 Other specified diseases of anus and rectum: Secondary | ICD-10-CM | POA: Diagnosis not present

## 2019-10-10 MED ORDER — LIDOCAINE HCL URETHRAL/MUCOSAL 2 % EX PRSY: 1.0000 "application " | PREFILLED_SYRINGE | Freq: Three times a day (TID) | CUTANEOUS | 0 refills | Status: AC

## 2019-10-10 MED ORDER — POLYETHYLENE GLYCOL 3350 17 GM/SCOOP PO POWD
ORAL | 0 refills | Status: DC
Start: 2019-10-10 — End: 2019-10-10

## 2019-10-10 MED ORDER — POLYETHYLENE GLYCOL 3350 17 GM/SCOOP PO POWD
ORAL | 0 refills | Status: AC
Start: 2019-10-10 — End: ?

## 2019-10-10 MED ORDER — LIDOCAINE HCL URETHRAL/MUCOSAL 2 % EX GEL
1.0000 "application " | Freq: Once | CUTANEOUS | Status: AC
  Administered 2019-10-10: 1 via TOPICAL
  Filled 2019-10-10: qty 5

## 2019-10-10 NOTE — Discharge Instructions (Signed)
Increase hydration and follow a high fiber diet. Use a stool softener such as Colace, DulcoLax, or MiraLax to help soften stool and relieve pain with bowel movements. Sitz baths (ask pharmacist if you need help finding this) and Epsom salt baths will also help improve symptoms. Apply topical medications to hemorrhoid twice daily. If symptoms persist longer than 1-2 weeks, please follow up with your primary care provider. Please return to ER for new or worsening symptoms, any additional concerns.  ° °

## 2019-10-10 NOTE — ED Provider Notes (Signed)
[facility]-EMERGENCY DEPT Provider Note  CSN: [id number] Arrival date & time: 10/09/19 1945  Chief Complaint(s) Rectal Pain  HPI Sarah Moreno is a 39 y.o. female   The history is provided by the patient.  CC: Rectal pain  Onset/Duration: 2 days, gradual Timing: Constant Quality: Aching throbbing Severity: Moderate to severe Modifying Factors:  Improved by: Nothing  Worsened by: Palpation, bowel movement Associated Signs/Symptoms:  Pertinent (+): Patient reports that she feels a bulge near her anal sphincter, constipation for several days  Pertinent (-): Fevers chills, nausea, vomiting, bleeding Context: Patient recently had a lap hysterectomy and was prescribed Percocets which she took for 1 week earlier this month.  Patient has been taking in fiber to assist with her constipation.   Past Medical History Past Medical History:  Diagnosis Date  . Allergic rhinitis   . Allergy   . Anemia   . Angio-edema   . Anxiety 11/2015  . Anxiety   . Arthritis   . Asthma   . 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 Bipolar 1 disorder ([facility]) 2000  . Depression   . Dysphagia   . Gastro-esophageal reflux disease without esophagitis   . Heart murmur    as a baby no longer has  . Migraine   . Mood disorder ([facility])   . Other abnormal glucose    gestational diabetes  . Psoriasis 2016  . PTSD (post-traumatic stress disorder) 11/2015  . PTSD (post-traumatic stress disorder)   . Seizures ([facility])   . Substance abuse ([facility])    Clean for 17 years  . Urticaria    Patient Active Problem List   Diagnosis Date Noted  . Chronic pelvic pain in female 09/13/2019  . Post-operative state 09/13/2019  . High risk human papilloma virus (HPV) infection of cervix 05/13/2019  . Pelvic pain 05/09/2019  . Vaginal pain 05/09/2019  . Mixed stress and urge urinary incontinence 05/09/2019  . Depression 05/09/2019  . Chronic post-traumatic stress disorder (PTSD)  05/09/2019  . Gastroesophageal reflux disease   . Regurgitation of food   . Moderate persistent asthma 01/09/2016  . Allergic rhinoconjunctivitis 01/09/2016  . Urticaria, chronic 01/09/2016  . Angioedema 01/09/2016   Home Medication(s) Prior to Admission medications   Medication Sig Start Date End Date Taking? Authorizing Provider  albuterol (VENTOLIN HFA) 108 (90 Base) MCG/ACT inhaler Inhale 1-2 puffs into the lungs every 6 (six) hours as needed (wheezing/shortness of breath.).  09/13/18   [provider]  aspirin EC 81 MG tablet Take 81 mg by mouth every 4 (four) hours as needed (chest pains.).    [provider]  budesonide-formoterol (SYMBICORT) 160-4.5 MCG/ACT inhaler Inhale 2 puffs into the lungs 2 (two) times daily as needed (respiratory issues.).    [provider]  cariprazine (VRAYLAR) capsule Take 1.5 mg by mouth at bedtime.     [provider]  FLUoxetine (PROZAC) 20 MG capsule Take 20 mg by mouth daily. 07/25/19   [provider]  ibuprofen (ADVIL) 800 MG tablet Take 1 tablet (800 mg total) by mouth every 6 (six) hours. 09/14/19   Willodean Rosenthal, MD  levocetirizine (XYZAL) 5 MG tablet Take 5 mg by mouth every evening.    [provider]  lidocaine (XYLOCAINE) 2 % jelly Apply 1 application topically 3 (three) times daily. 10/10/19   Nira Conn, MD  Multiple Vitamin (MULTIVITAMIN WITH MINERALS) TABS tablet Take 1 tablet by mouth 2 (two) times a week.  [provider]  Olopatadine HCl (PATADAY) 0.2 % SOLN Place 1 drop into both eyes 2 (two) times daily as needed (allergy/irritated eyes.).     [provider]  omeprazole (PRILOSEC) 40 MG capsule TAKE 1 CAPSULE BY MOUTH TWICE A DAY Patient taking differently: Take 40 mg by mouth in the morning and at bedtime.  08/16/18   Tressia Danas, MD  oxyCODONE-acetaminophen (PERCOCET/ROXICET) 5-325 MG tablet Take 1 tablet by mouth every 4 (four) hours as  needed for moderate pain or severe pain. 09/13/19   Willodean Rosenthal, MD  Polyethyl Glycol-Propyl Glycol (SYSTANE) 0.4-0.3 % SOLN Place 1 drop into both eyes 3 (three) times daily as needed (dry/irritated eyes.).    [provider]  polyethylene glycol powder (MIRALAX) 17 GM/SCOOP powder Please take 6 capfuls of MiraLAX in a 32 oz bottle of Gatorade over 2-4 hour period. The following day take 3 capfuls. On day 3 start taking 1 capful 3 times a day. Slowly cut back as needed until you have normal bowel movements. 10/10/19   Arnette Driggs, Amadeo Garnet, MD  prazosin (MINIPRESS) 1 MG capsule Take 1 mg by mouth at bedtime. 07/24/19   [provider]  sucralfate (CARAFATE) 1 g tablet Take 1 g by mouth 2 (two) times daily before a meal.    [provider]  sulfamethoxazole-trimethoprim (BACTRIM DS) 800-160 MG tablet Take 1 tablet by mouth 2 (two) times daily for 10 days. 10/06/19 10/16/19  Mesner, Barbara Cower, MD  tolterodine (DETROL LA) 4 MG 24 hr capsule Take 4 mg by mouth daily.  04/10/19   [provider]                                                                                                                                    Past Surgical History Past Surgical History:  Procedure Laterality Date  . CERVICAL BIOPSY  W/ LOOP ELECTRODE EXCISION    . ESOPHAGEAL MANOMETRY N/A 05/19/2018   Procedure: ESOPHAGEAL MANOMETRY (EM);  Surgeon: Tressia Danas, MD;  [facility];  Service: Gastroenterology;  Laterality: N/A;  . HEMORROIDECTOMY  2009  . INGUINAL HERNIA REPAIR Left 2010  . LAPAROSCOPIC VAGINAL HYSTERECTOMY WITH SALPINGECTOMY Bilateral 09/13/2019   Procedure: LAPAROSCOPIC ASSISTED VAGINAL HYSTERECTOMY WITH SALPINGECTOMY;  Surgeon: Willodean Rosenthal, MD;  Location: [facility] [place];  Service: Gynecology;  Laterality: Bilateral;  . [place] IMPEDANCE STUDY  05/19/2018   Procedure: [place] IMPEDANCE STUDY;  Surgeon: Tressia Danas, MD;  [facility];  Service: Gastroenterology;;  . TUBAL LIGATION  2009  . TUBAL LIGATION    . UPPER GASTROINTESTINAL ENDOSCOPY  04/26/2018   Family History Family History  Problem Relation Age of Onset  . COPD Father   . Drug abuse Father   . Endometrial cancer Maternal Aunt   . Breast cancer Maternal Aunt   . Colon cancer Neg Hx   . Esophageal cancer Neg Hx   . Rectal cancer  Neg Hx   . Stomach cancer Neg Hx   . Colon polyps Neg Hx   . Depression Mother   . Depression Father   . Depression Sister   . Breast cancer Maternal Aunt   . Schizophrenia Paternal Aunt   . Depression Paternal Aunt   . Suicidality Paternal Uncle   . Depression Paternal Uncle   . Depression Sister   . Epilepsy Sister   . Breast cancer Maternal Aunt   . Breast cancer Maternal Aunt   . Depression Paternal Aunt   . Depression Paternal Aunt   . Depression Paternal Aunt   . Depression Paternal Aunt   . Depression Paternal Aunt   . Depression Paternal Uncle   . Depression Paternal Uncle     Social History Social History   Tobacco Use  . Smoking status: Former Smoker    Packs/day: 0.25    Years: 4.00    Pack years: 1.00    Types: Cigarettes  . Smokeless tobacco: Never Used  . Tobacco comment: Smokes CBD when anxious no longer  quit when 20   Vaping Use  . Vaping Use: Never used  Substance Use Topics  . Alcohol use: Not Currently  . Drug use: Not Currently   Allergies Amitriptyline, Orajel mouth-aid [carbamide peroxide], Benzocaine, Other, Penicillin g, Percocet [oxycodone-acetaminophen], and Nsaids  Review of Systems Review of Systems All other systems are reviewed and are negative for acute change except as noted in the HPI  Physical Exam Vital Signs  I have reviewed the triage vital signs BP (!) 117/98 (BP Location: Left Arm)   Pulse (!) 114   Temp (!) 97.5 F (36.4 C) (Oral)   Resp 18   Ht 5\' 3"  (1.6 m)   Wt 90.3 kg   LMP 08/19/2019 (Approximate)   SpO2 98%   BMI 35.25 kg/m    Physical Exam Vitals reviewed.  Constitutional:      General: She is not in acute distress.    Appearance: She is well-developed. She is not diaphoretic.  HENT:     Head: Normocephalic and atraumatic.     Right Ear: External ear normal.     Left Ear: External ear normal.     Nose: Nose normal.  Eyes:     General: No scleral icterus.    Conjunctiva/sclera: Conjunctivae normal.  Neck:     Trachea: Phonation normal.  Cardiovascular:     Rate and Rhythm: Normal rate and regular rhythm.  Pulmonary:     Effort: Pulmonary effort is normal. No respiratory distress.     Breath sounds: No stridor.  Abdominal:     General: There is no distension.       Comments: Trochar incisions clearn, dry, intact. No erythema  Genitourinary:    Rectum: Tenderness and external hemorrhoid (nonthrombosed) present. No anal fissure. Normal anal tone.     Comments: Solid stool within the rectum Musculoskeletal:        General: Normal range of motion.     Cervical back: Normal range of motion.  Neurological:     Mental Status: She is alert and oriented to person, place, and time.  Psychiatric:        Behavior: Behavior normal.     ED Results and Treatments Labs (all labs ordered are listed, but only abnormal results are displayed) Labs Reviewed - No data to display  EKG  EKG Interpretation  Date/Time:    Ventricular Rate:    PR Interval:    QRS Duration:   QT Interval:    QTC Calculation:   R Axis:     Text Interpretation:        Radiology No results found.  Pertinent labs & imaging results that were available during my care of the patient were reviewed by me and considered in my medical decision making (see chart for details).  Medications Ordered in ED Medications  lidocaine (XYLOCAINE) 2 % jelly 1 application (has no administration in time range)                                                                                                                                     Procedures Procedures  (including critical care time)  Medical Decision Making / ED Course I have reviewed the nursing notes for this encounter and the patient's prior records (if available in EHR or on provided paperwork).   Sarah Moreno was evaluated in Emergency Department on 10/10/2019 for the symptoms described in the history of present illness. She was evaluated in the context of the global COVID-19 pandemic, which necessitated consideration that the patient might be at risk for infection with the SARS-CoV-2 virus that causes COVID-19. Institutional protocols and algorithms that pertain to the evaluation of patients at risk for COVID-19 are in a state of rapid change based on information released by regulatory bodies including the CDC and federal and state organizations. These policies and algorithms were followed during the patient's care in the ED.  Rectal pain consistent with prolapsed hemorrhoid that is nonthrombosed.  Provided with lido jelly. Recommended bowel prep regimen.      Final Clinical Impression(s) / ED Diagnoses Final diagnoses:  External hemorrhoid  Rectal pain  Drug-induced constipation   The patient appears reasonably screened and/or stabilized for discharge and I doubt any other medical condition or other [facility] requiring further screening, evaluation, or treatment in the ED at this time prior to discharge. Safe for discharge with strict return precautions.  Disposition: Discharge  Condition: Good  I have discussed the results, Dx and Tx plan with the patient/family who expressed understanding and agree(s) with the plan. Discharge instructions discussed at length. The patient/family was given strict return precautions who verbalized understanding of the instructions. No further questions at time of discharge.    ED Discharge Orders         Ordered     polyethylene glycol powder (MIRALAX) 17 GM/SCOOP powder     Discontinue  Reprint     10/10/19 0058    lidocaine (XYLOCAINE) 2 % jelly  3 times daily     Discontinue  Reprint     10/10/19 0058             Follow Up: Dois Davenport, MD [address] [place] [address] [place] [address] [phone]  Schedule an appointment as soon as possible for a visit  As needed      This chart was dictated using voice recognition software.  Despite best efforts to proofread,  errors can occur which can change the documentation meaning.   Nira Connardama, Yvaine Jankowiak Eduardo, MD 10/10/19 [phone]

## 2019-10-24 ENCOUNTER — Telehealth: Payer: Self-pay

## 2019-10-24 NOTE — Telephone Encounter (Signed)
Pt called stating she noticed some swelling and pus around her incision site.Pt is scheduled to be seen on 10/26/19.  Shena Vinluan l Gary Gabrielsen, CMA

## 2019-10-26 ENCOUNTER — Other Ambulatory Visit (HOSPITAL_COMMUNITY)
Admission: RE | Admit: 2019-10-26 | Discharge: 2019-10-26 | Disposition: A | Discharge status: Home / Self Care | Payer: Medicare HMO | Source: Ambulatory Visit | Attending: Obstetrics & Gynecology | Admitting: Obstetrics & Gynecology

## 2019-10-26 ENCOUNTER — Encounter: Payer: Medicare HMO | Admitting: Obstetrics & Gynecology

## 2019-10-26 ENCOUNTER — Encounter: Payer: Self-pay | Admitting: Obstetrics & Gynecology

## 2019-10-26 ENCOUNTER — Other Ambulatory Visit: Payer: Self-pay

## 2019-10-26 ENCOUNTER — Ambulatory Visit (HOSPITAL_BASED_OUTPATIENT_CLINIC_OR_DEPARTMENT_OTHER): Payer: Medicare HMO | Admitting: Obstetrics & Gynecology

## 2019-10-26 VITALS — BP 94/54 | HR 88 | Ht 63.0 in | Wt 203.0 lb

## 2019-10-26 DIAGNOSIS — B373 Candidiasis of vulva and vagina: Secondary | ICD-10-CM | POA: Insufficient documentation

## 2019-10-26 DIAGNOSIS — Z9889 Other specified postprocedural states: Secondary | ICD-10-CM

## 2019-10-26 DIAGNOSIS — Z9079 Acquired absence of other genital organ(s): Secondary | ICD-10-CM

## 2019-10-26 DIAGNOSIS — Z48816 Encounter for surgical aftercare following surgery on the genitourinary system: Secondary | ICD-10-CM

## 2019-10-26 DIAGNOSIS — B3731 Acute candidiasis of vulva and vagina: Secondary | ICD-10-CM

## 2019-10-26 DIAGNOSIS — Z90710 Acquired absence of both cervix and uterus: Secondary | ICD-10-CM

## 2019-10-26 MED ORDER — TERCONAZOLE 0.4 % VA CREA: 1.0000 | TOPICAL_CREAM | Freq: Every day | VAGINAL | 0 refills | Status: AC

## 2019-10-26 MED ORDER — FLUCONAZOLE 150 MG PO TABS: 150.0000 mg | ORAL_TABLET | Freq: Once | ORAL | 0 refills | Status: AC

## 2019-10-26 MED FILL — FLUCONAZOLE 150 MG TABS: 150 | 1 days supply | Qty: 1 | Fill #0

## 2019-10-26 MED FILL — TERCONAZOLE 0.4% CREAM: 0.4 | 7 days supply | Qty: 45 | Fill #0

## 2019-10-26 NOTE — Progress Notes (Signed)
History:  39 y.o.LMP here today for 2 week post op check.Pt is s/p LAVH with bilateral salpingectomy on 09/13/2019.  Pt reports that she is doing well. She is eating and passing stols without difficulty.   Pt is on Bactrim for a skin cellulitis. She reports vaginal itching.    The following portions of the patient's history were reviewed and updated as appropriate: allergies, current medications, past family history, past medical history, past social history, past surgical history and problem list.  Review of Systems:  Pertinent items are noted in HPI.    Objective:  Physical Exam BP (!) 94/54   Pulse 88   Ht 5\' 3"  (1.6 m)   Wt 203 lb (92.1 kg)   LMP 08/19/2019 (Approximate)   BMI 35.96 kg/m   CONSTITUTIONAL: Well-developed, well-nourished female in no acute distress.  HENT:  Normocephalic, atraumatic EYES: Conjunctivae and EOM are normal. No scleral icterus.  NECK: Normal range of motion SKIN: Skin is warm and dry. No rash noted. Not diaphoretic.No pallor. NEUROLGIC: Alert and oriented to person, place, and time. Normal coordination.  Abd: Soft, nontender and nondistended; her port sites are healing well. .  Pelvic: deferred  [facility] Surg path 09/13/2019 SURGICAL PATHOLOGY  CASE: [id number]  PATIENT: Sarah Moreno  Surgical Pathology Report   Clinical History: Pelvic pain (crm)   FINAL MICROSCOPIC DIAGNOSIS:   A. UTERUS AND BILATERAL TUBES, HYSTERECTOMY WITH BILATERAL  SALPINGECTOMY:  Uterus:  - Benign inactive endometrium  - No hyperplasia or malignancy identified   Cervix:  - Benign cervix  - No dysplasia or malignancy identified   Bilateral Fallopian tubes:  - Benign fallopian tubes with evidence of prior tubal ligation and  paratubal cyst(s)  - No malignancy identified    GROSS DESCRIPTION:   Specimen: Received fresh labeled cervix, uterus, bilateral fallopian  tubes  Specimen integrity: An intact uterus and cervix with attached bilateral   fallopian tubes  Size and shape: A symmetrical uterus measuring 8.6 cm fundus to cervix  is 6.5 cm cornu to cornu by 4.2 cm anterior to posterior  Weight: 121 g  Serosa: Tan-pink, hyperemic and smooth  Cervix: Ectocervix is tan-pink, glistening, measures 3.3 x 3.0 cm with a  0.8 cm cervical os. Endocervical canal measures 2.0 cm in length, is  tan-pink and corrugated. The cervical stroma is tan-pink and fibrous.  Endometrium: Triangular endometrial cavity measures 4.6 x 3.0 cm. The  endometrium is tan-red and hyperemic. Endometrium measures 0.1 cm in  thickness.  Myometrium: Myometrium is tan-pink and trabeculated, and no intramural  lesions are grossly identified.  Right adnexa: The right fimbriated fallopian tube measures 6.5 cm in  length by 0.7 cm in diameter. The serosal surface is pink-purple,  smooth, and disrupted with blunted ends, suggestive of previous tubal  ligation. An embedded white plastic clip is identified at the disrupted  area. The proximal lumen displays a moderately dilated cut surface, and  the distal aspect of the tube displays a tan-pink cut surface with a  pinpoint lumen.  Left adnexa: The left fimbriated fallopian tube measures 6.2 cm in  length by 0.7 cm in diameter. The serosal surface is tan-pink, smooth,  and disrupted with blunted ends suggestive of previous tubal ligation.  An embedded white plastic clip is identified at the disrupted area.  Sectioning the fallopian tube reveals a tan-pink cut surface with a  pinpoint lumen.  Block Summary:  10 blocks submitted  1 = anterior cervix  2 = posterior cervix  3 and 4 = anterior endomyometrium  5 and 6 = posterior endomyometrium  7 and 8 = right fallopian tube  9 and 10 = left fallopian tube  Sarah Moreno 09/14/2019)    Assessment & Plan:  6 week post op check following LAVH with bilateral salpingectomy on 09/13/2019. Now with yeast infection. Doing well otherwise.   Reviewed her surg path.   Reviewed  post op instructions and activities  Gradual increase in activities  F/u in 3 months or sooner prn  Reviewed no intercourse for 2 more weeks   All questions answered.   Sarah Moreno, M.D., [facility]

## 2019-10-27 LAB — CERVICOVAGINAL ANCILLARY ONLY
Candida Glabrata: NEGATIVE
Candida Vaginitis: POSITIVE — AB
Comment: NEGATIVE
Comment: NEGATIVE

## 2019-11-04 ENCOUNTER — Encounter: Payer: Medicare HMO | Admitting: Obstetrics & Gynecology

## 2020-03-16 ENCOUNTER — Encounter: Payer: Self-pay | Admitting: General Practice

## 2021-02-17 IMAGING — US US TRANSVAGINAL NON-OB
1 series · 13 of 25 positions shown · non-contrast
Comparison: None

CLINICAL DATA: Pelvic, vaginal, and back pain for 3 weeks,
worsening, history of LEEP, tubal ligation, hernia repair, fibroids
based on prior outside CT

EXAM:
TRANSABDOMINAL AND TRANSVAGINAL ULTRASOUND OF PELVIS
TECHNIQUE: Both transabdominal and transvaginal ultrasound examinations of the
pelvis were performed. Transabdominal technique was performed for
global imaging of the pelvis including uterus, ovaries, adnexal
regions, and pelvic cul-de-sac. It was necessary to proceed with
endovaginal exam following the transabdominal exam to visualize the
endometrium and RIGHT ovary.

[Series 1: us transvaginal non-ob · 0.25mm/px · 13 of 115 slices shown]
[im 1/115]
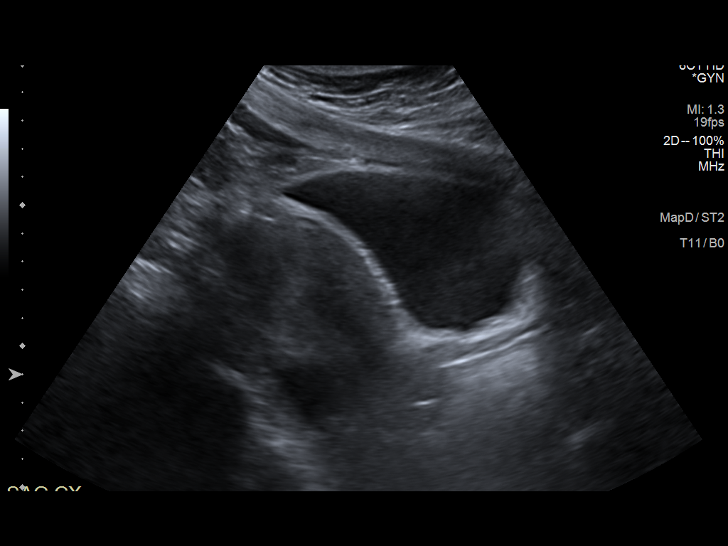
[im 10/115]
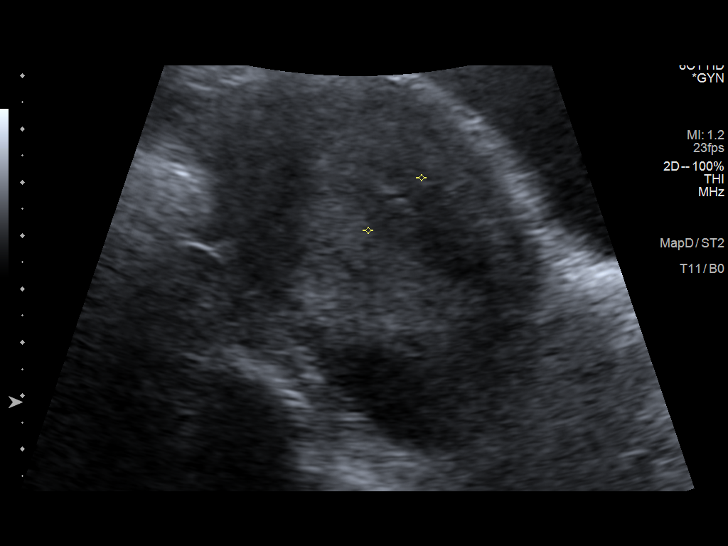
[im 20/115]
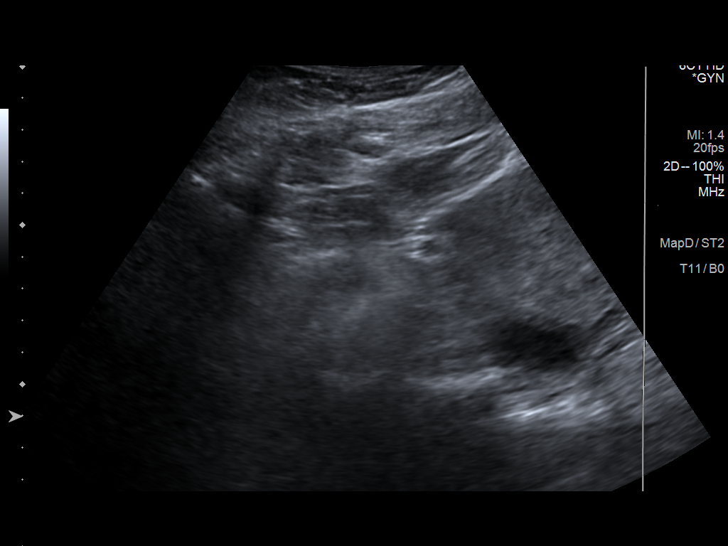
[im 29/115]
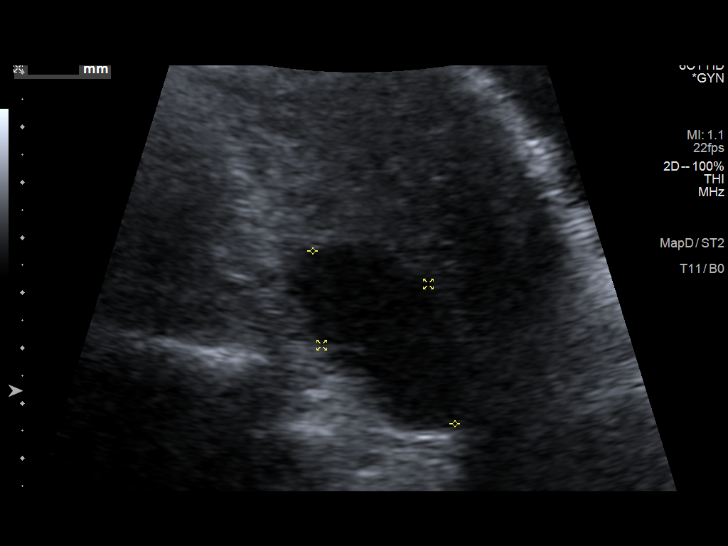
[im 39/115]
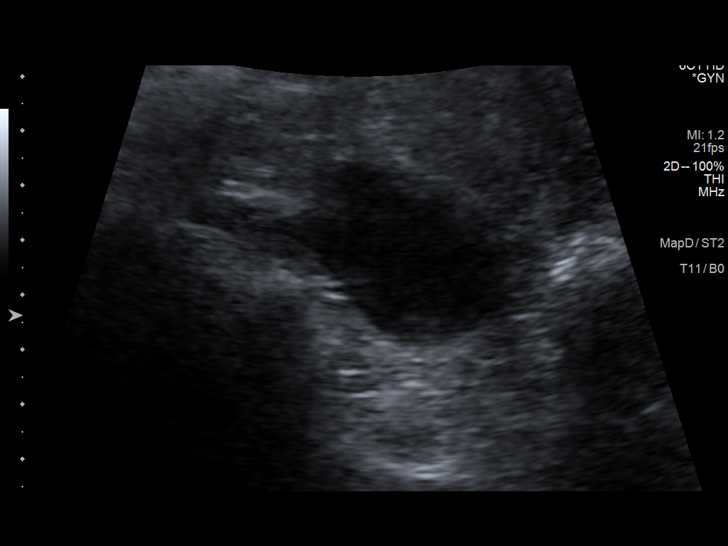
[im 48/115]
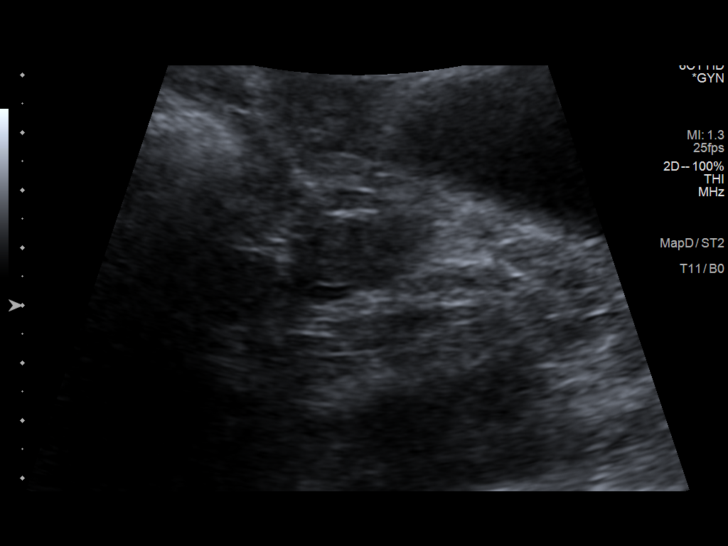
[im 58/115]
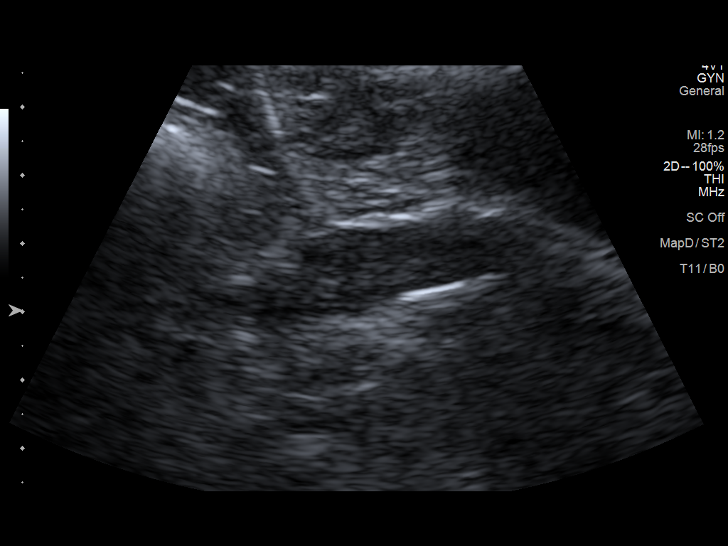
[im 67/115]
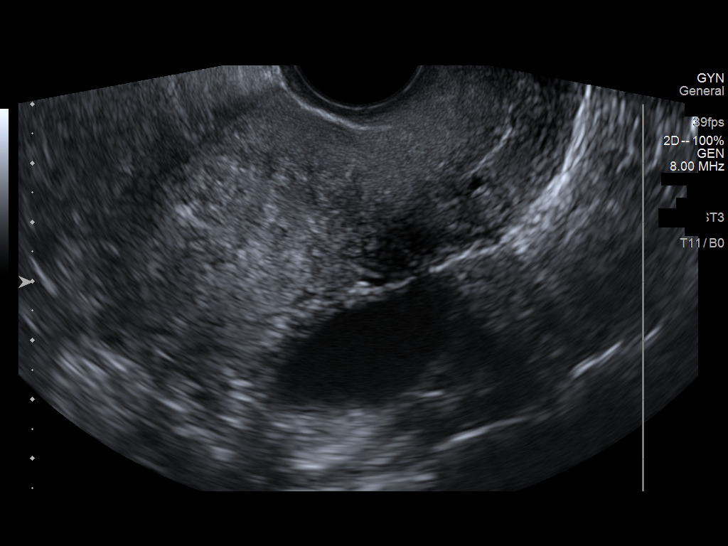
[im 77/115]
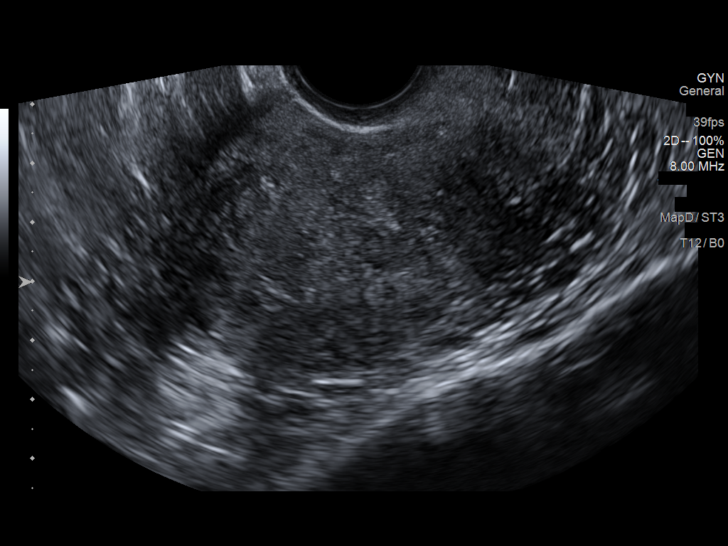
[im 86/115]
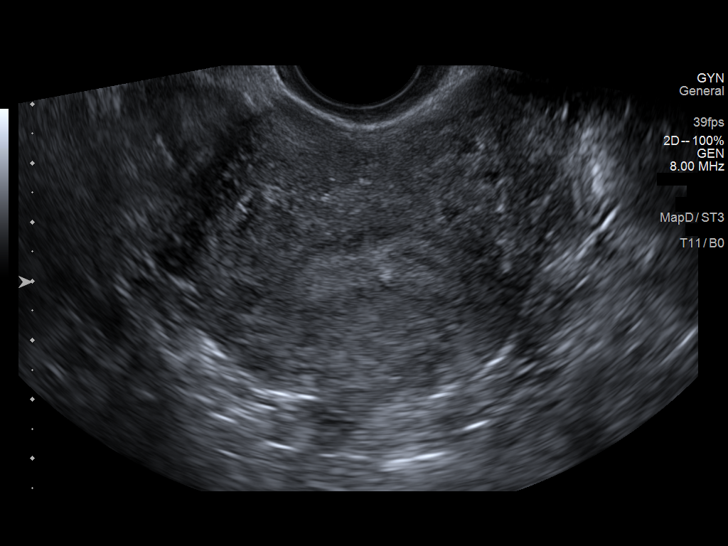
[im 96/115]
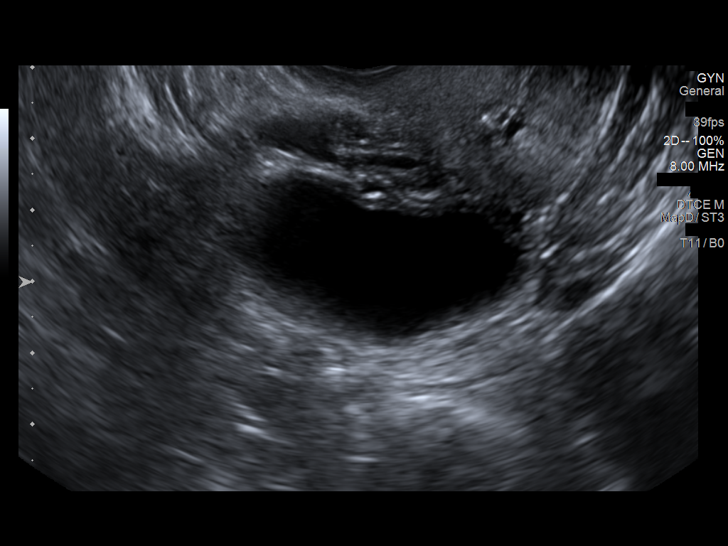
[im 105/115]
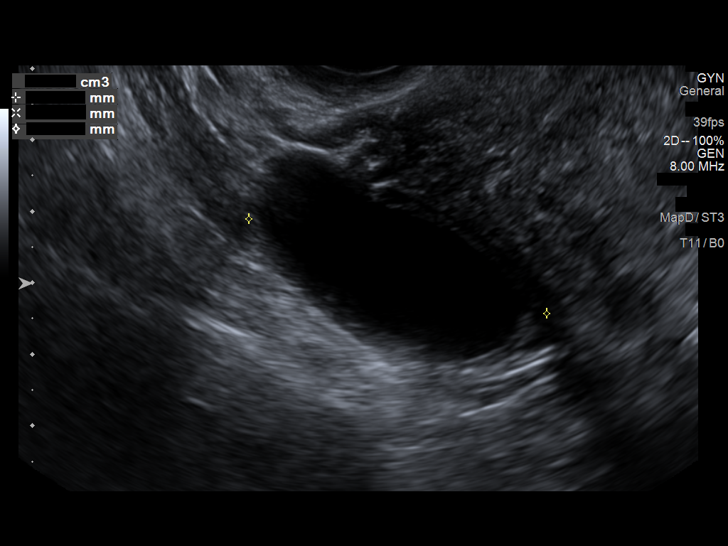
[im 115/115]
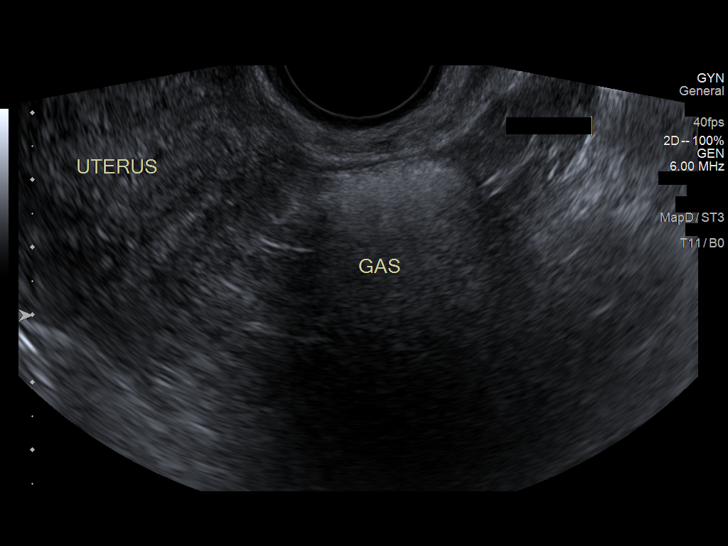

[13 of 25 positions shown; findings below may reference images not displayed]

FINDINGS: Uterus

Measurements: 9.5 x 5.2 x 6.1 cm = volume: 157 mL. Anteverted.
Suspected transmural leiomyoma subtly visualized at the posterior
upper uterus 3.7 x 2.2 x 3.4 cm. No other focal uterine masses.

Endometrium

Thickness: 5 mm.  No endometrial fluid or focal abnormality

Right ovary

Measurements: 4.7 x 2.3 x 4.4 cm = volume: 225 mL. Simple cyst RIGHT
ovary 4.5 x 2.1 x 3.9 cm

Left ovary

Measurements: 3.6 x 1.5 x 1.9 cm = volume: 5 mL. Normal morphology
without mass

Other findings

Numerous bowel loops in pelvis. Minimally prominent RIGHT adnexal
vessels. No free fluid or additional adnexal masses.
IMPRESSION: Suspected subtle transmural leiomyoma at the posterior upper uterus
3.7 cm greatest size.

Simple cyst RIGHT ovary 4.5 cm greatest diameter.

## 2021-07-16 IMAGING — US US PELVIS COMPLETE
1 series · 13 of 13 positions shown · non-contrast
Comparison: Pelvic ultrasound 05/10/2019

CLINICAL DATA: Assess for postoperative complication, hysterectomy
and salpingectomy performed 09/13/2019

EXAM:
TRANSABDOMINAL AND TRANSVAGINAL ULTRASOUND OF PELVIS
TECHNIQUE: Both transabdominal and transvaginal ultrasound examinations of the
pelvis were performed. Transabdominal technique was performed for
global imaging of the pelvis including uterus, ovaries, adnexal
regions, and pelvic cul-de-sac. It was necessary to proceed with
endovaginal exam following the transabdominal exam to visualize the
ovaries and adnexa.

[Series 1: us pelvis complete · 13 of 13 slices shown]
[im 1/13]
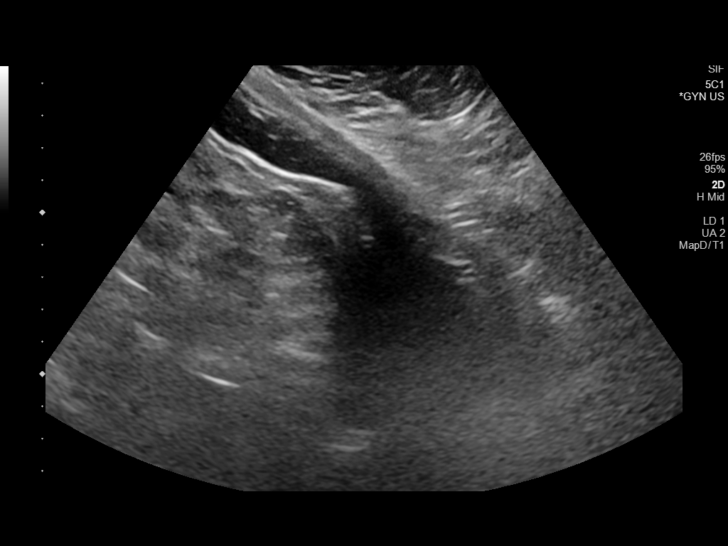
[im 2/13]
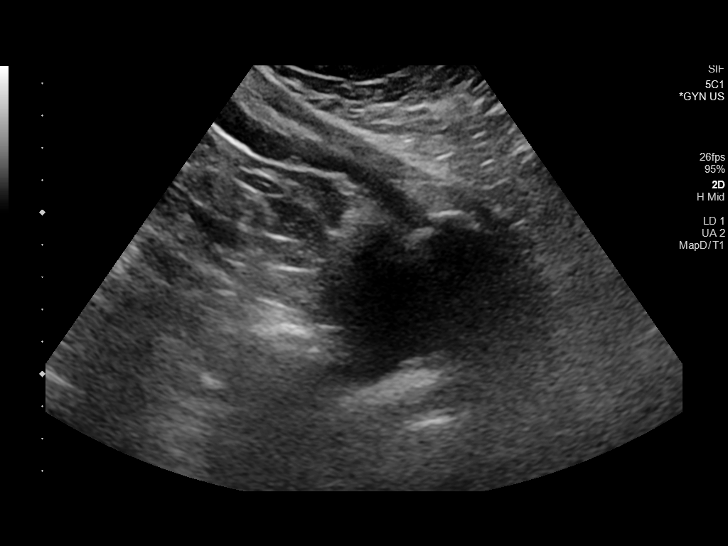
[im 3/13]
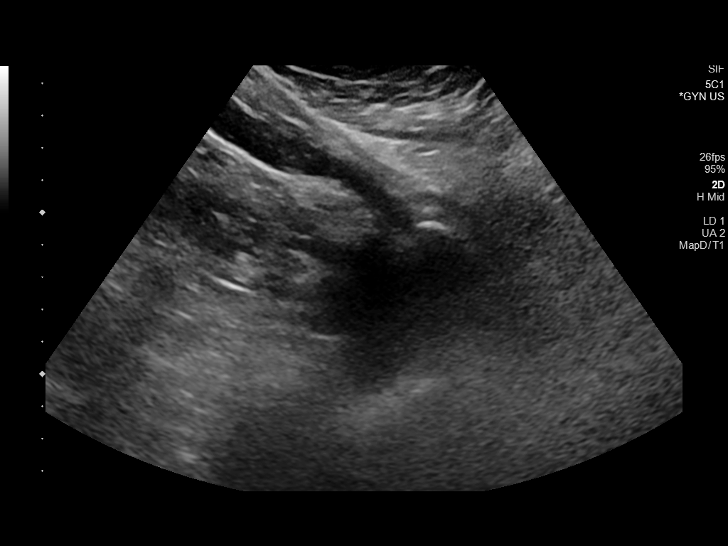
[im 4/13]
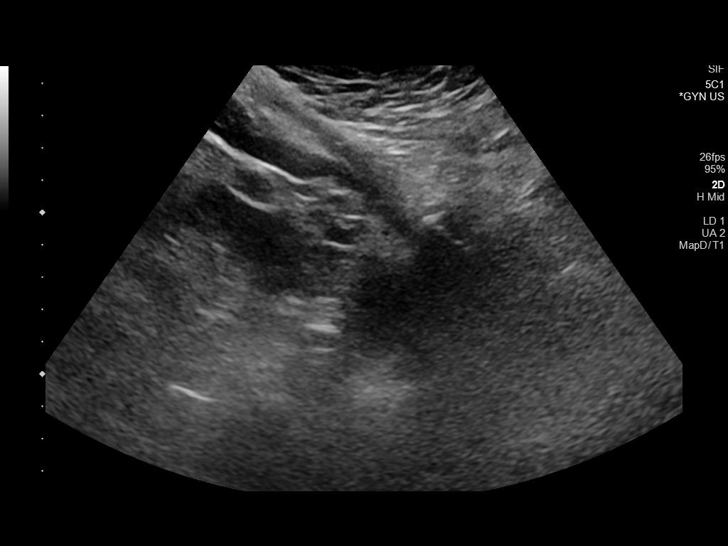
[im 5/13]
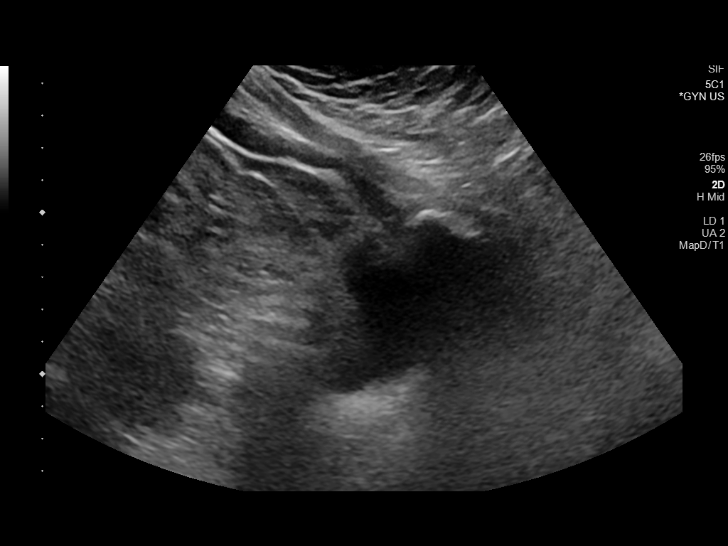
[im 6/13]
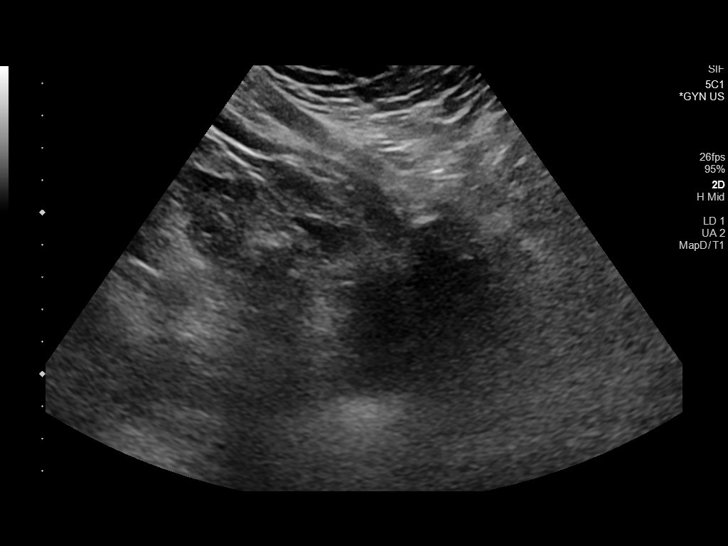
[im 7/13]
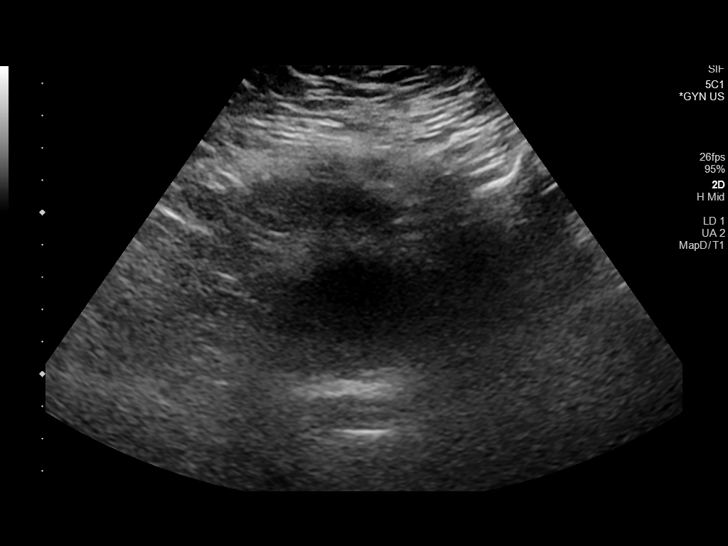
[im 8/13]
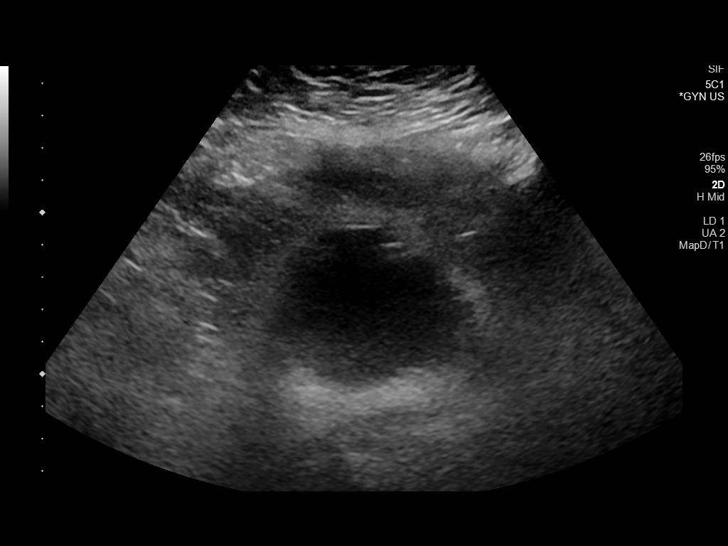
[im 9/13]
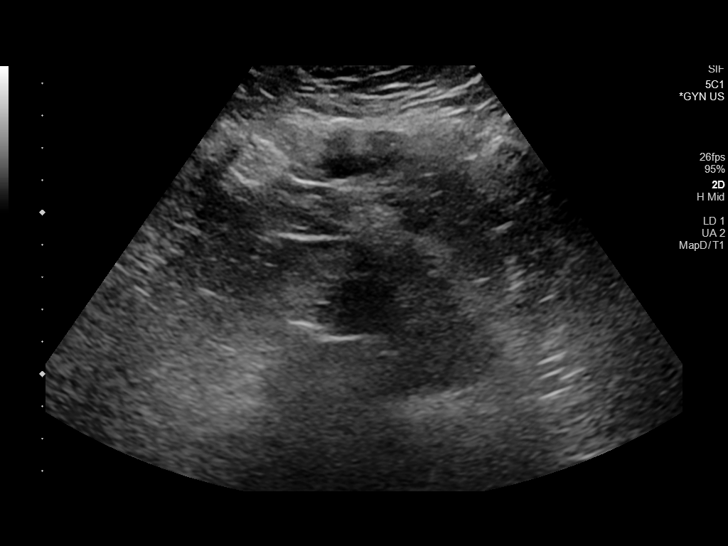
[im 10/13]
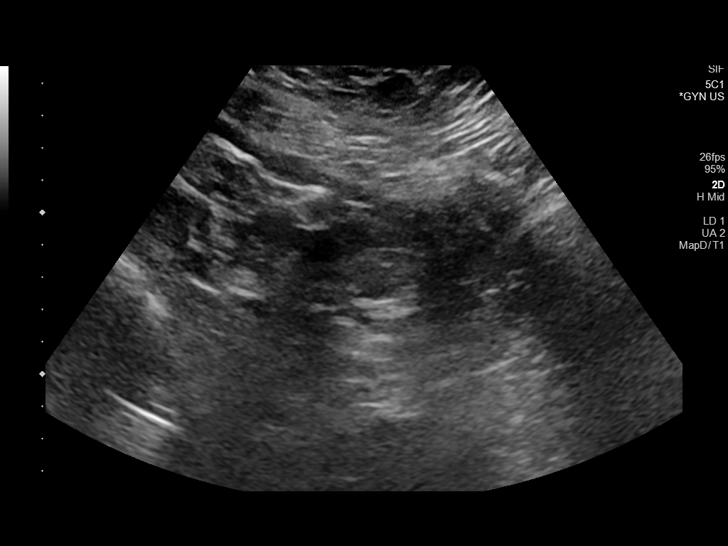
[im 11/13]
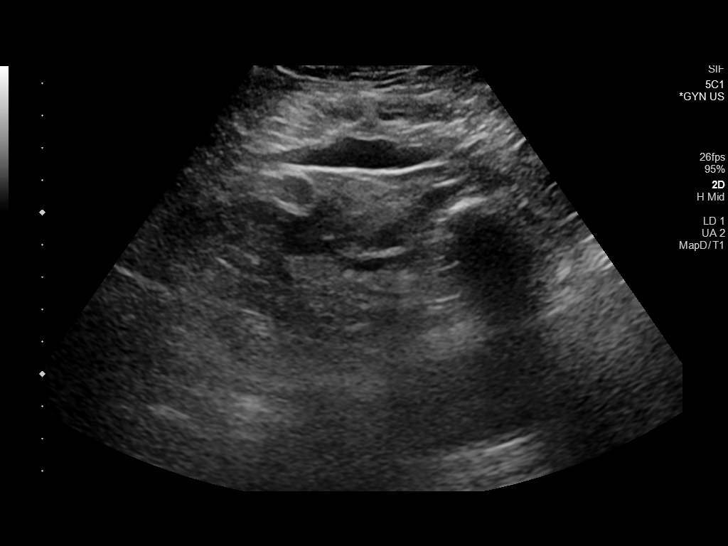
[im 12/13]
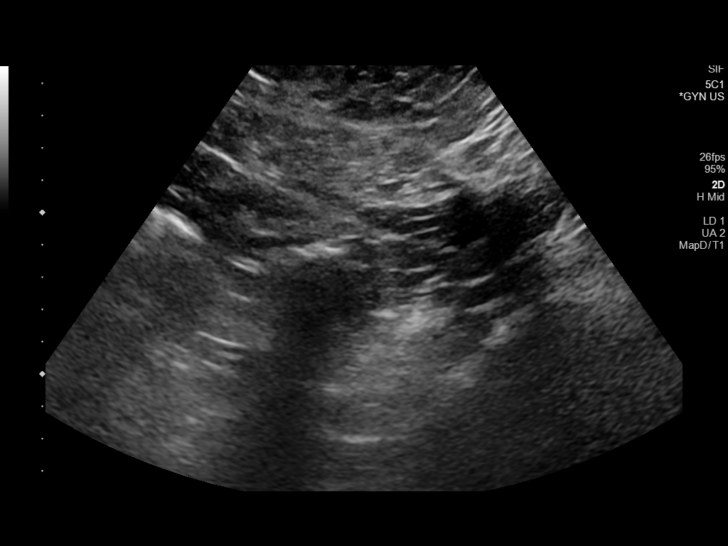
[im 13/13]
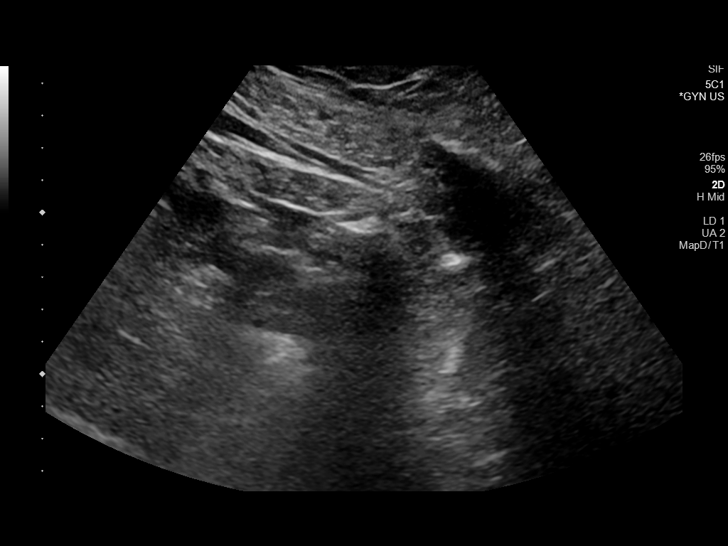

[13 of 13 positions shown; findings below may reference images not displayed]

FINDINGS: Uterus

Surgically absent.

Endometrium

Surgically absent

Right ovary

Not visualized.

Left ovary

Not visualized.

Other findings

Small volume of free fluid is seen in the pelvis. No discernible
organized collection or abscess is seen along vaginal cuff.
IMPRESSION: Small volume of fluid in the pelvis is nonspecific.

No discernible organized collection along the vaginal cuff or within
the colon sac. If there is persisting clinical concern, advanced
cross-sectional imaging could be obtained.

Nonvisualization of the ovaries.
